# Patient Record
Sex: Male | Born: 1935 | Race: White | Hispanic: No | Marital: Married | Smoking: Never smoker
Health system: Southern US, Community
[De-identification: ages and names within clinical notes are randomized; demographics above are authoritative.]

## PROBLEM LIST (undated history)

## (undated) DIAGNOSIS — I10 Essential (primary) hypertension: Secondary | ICD-10-CM

## (undated) DIAGNOSIS — H269 Unspecified cataract: Secondary | ICD-10-CM

## (undated) HISTORY — DX: Unspecified cataract: H26.9

## (undated) HISTORY — DX: Essential (primary) hypertension: I10

## (undated) HISTORY — PX: PROSTATE SURGERY: SHX751

---

## 2000-01-05 ENCOUNTER — Ambulatory Visit (HOSPITAL_COMMUNITY): Admission: RE | Admit: 2000-01-05 | Discharge: 2000-01-05 | Payer: Self-pay | Admitting: Gastroenterology

## 2004-02-22 ENCOUNTER — Ambulatory Visit (HOSPITAL_COMMUNITY): Admission: RE | Admit: 2004-02-22 | Discharge: 2004-02-22 | Payer: Self-pay | Admitting: Ophthalmology

## 2006-10-01 ENCOUNTER — Ambulatory Visit: Payer: Self-pay | Admitting: Family Medicine

## 2006-10-01 ENCOUNTER — Inpatient Hospital Stay (HOSPITAL_COMMUNITY): Admission: EM | Admit: 2006-10-01 | Discharge: 2006-10-07 | Payer: Self-pay | Admitting: Emergency Medicine

## 2006-11-07 ENCOUNTER — Inpatient Hospital Stay (HOSPITAL_COMMUNITY): Admission: RE | Admit: 2006-11-07 | Discharge: 2006-11-10 | Payer: Self-pay | Admitting: Urology

## 2006-11-07 ENCOUNTER — Encounter (INDEPENDENT_AMBULATORY_CARE_PROVIDER_SITE_OTHER): Payer: Self-pay | Admitting: Urology

## 2009-05-02 IMAGING — US US RENAL
1 series · 14 of 25 positions shown · non-contrast
Comparison: CT scan 10/02/06.

CLINICAL DATA: Acute renal failure.  
RENAL/URINARY TRACT ULTRASOUND:
TECHNIQUE: Complete ultrasound examination of the urinary tract was performed including evaluation of the kidneys, renal collecting systems, and urinary bladder.

[Series 1: unknown · 0.27mm/px · 14 of 31 slices shown]
[im 1/31]
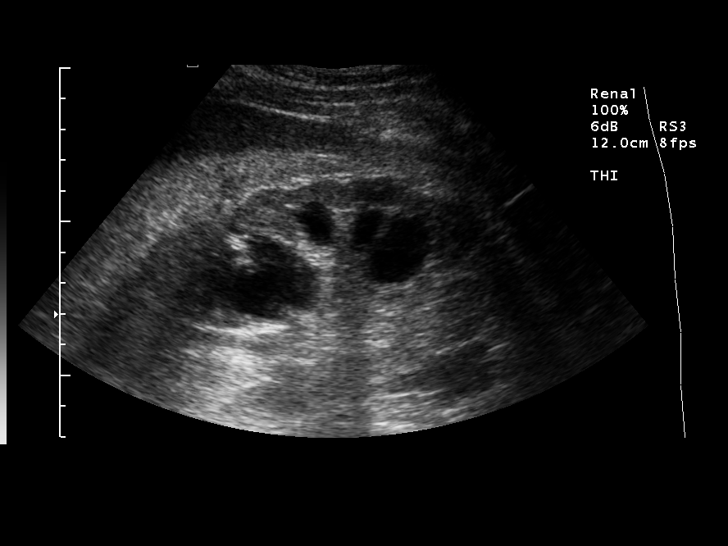
[im 3/31]
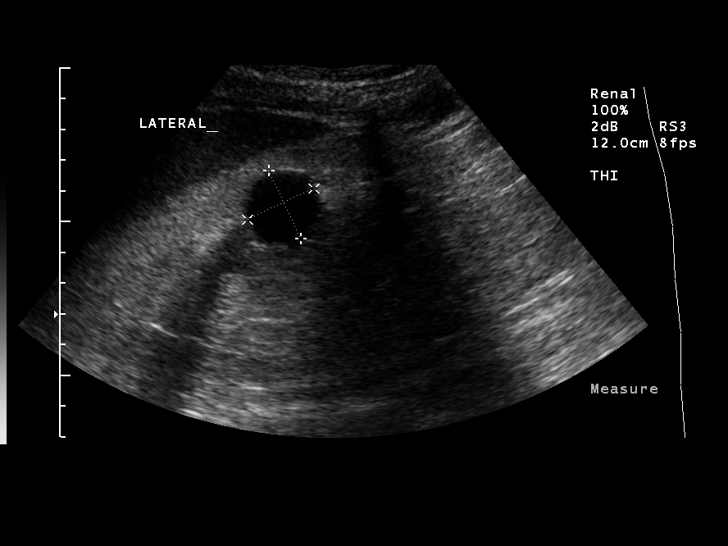
[im 6/31]
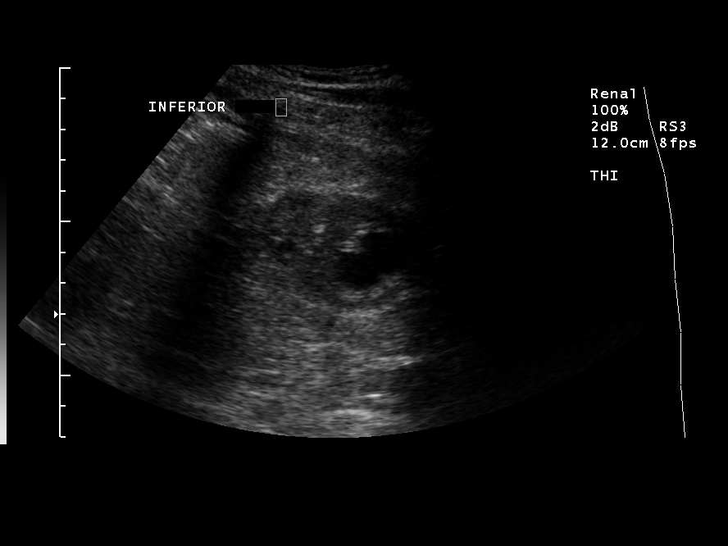
[im 8/31]
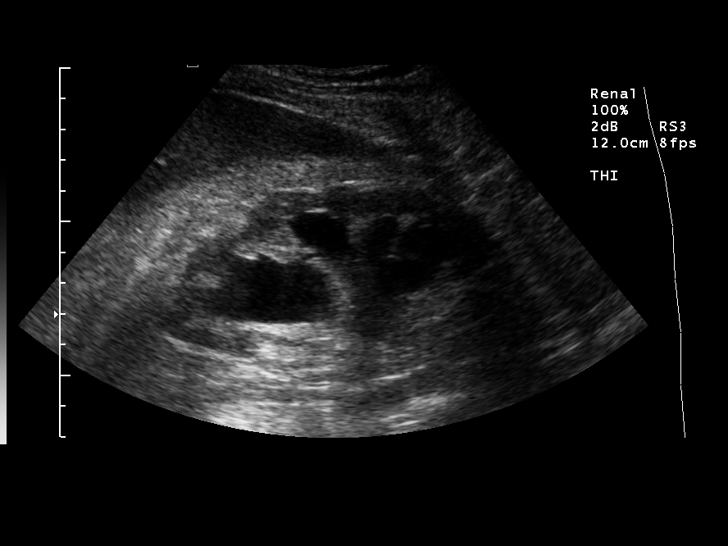
[im 11/31]
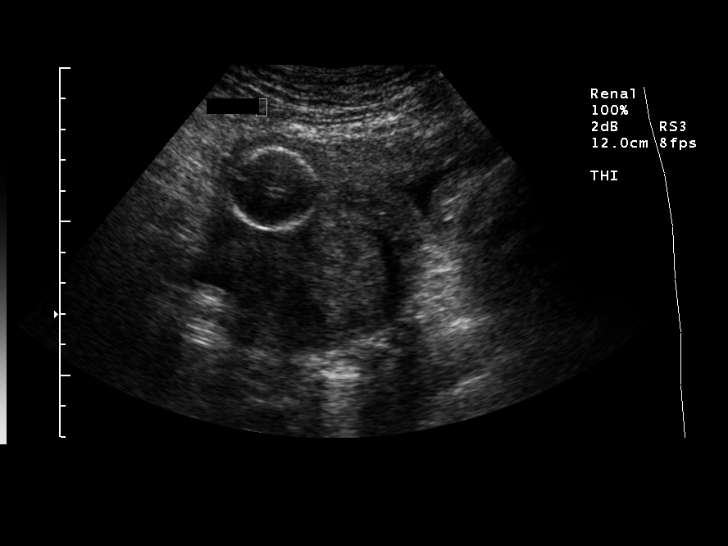
[im 12/31]
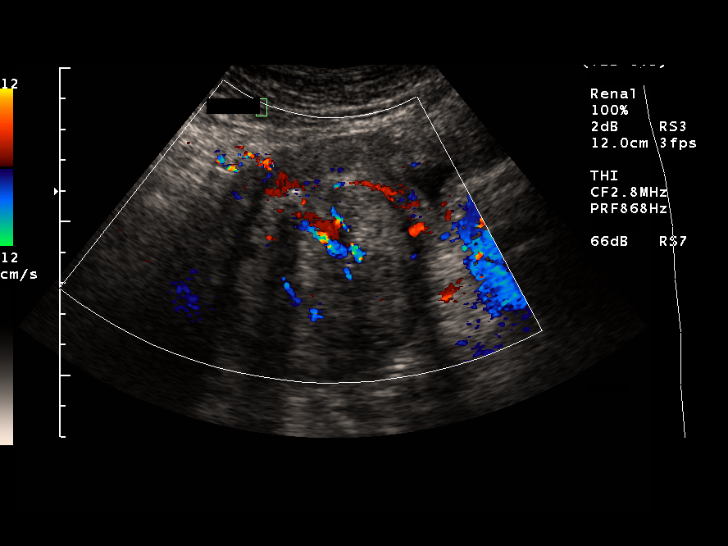
[im 14/31]
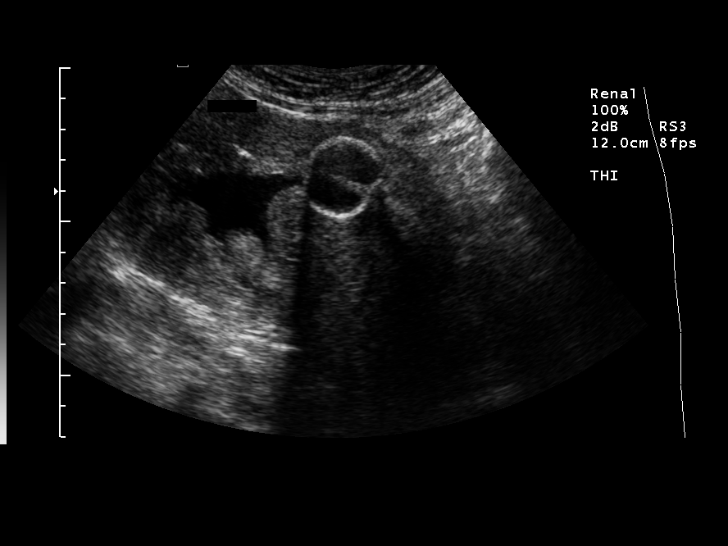
[im 17/31]
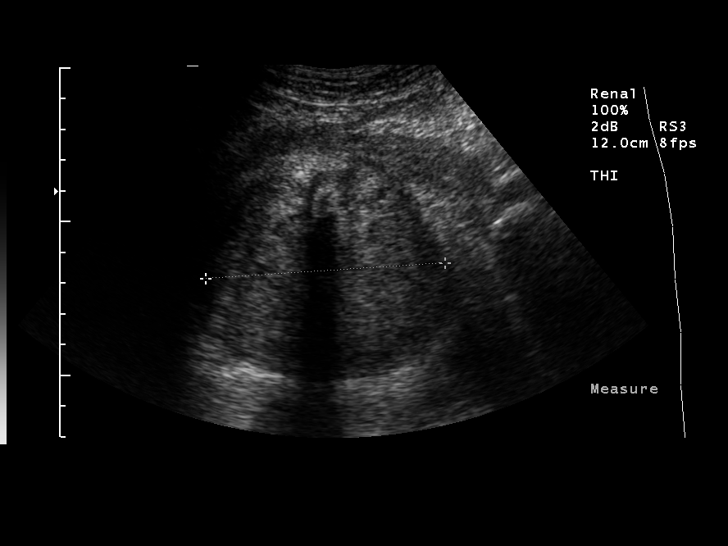
[im 19/31]
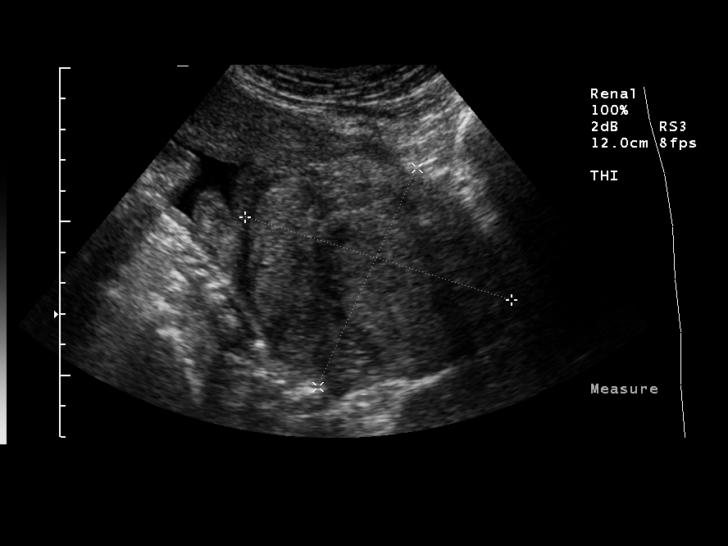
[im 21/31]
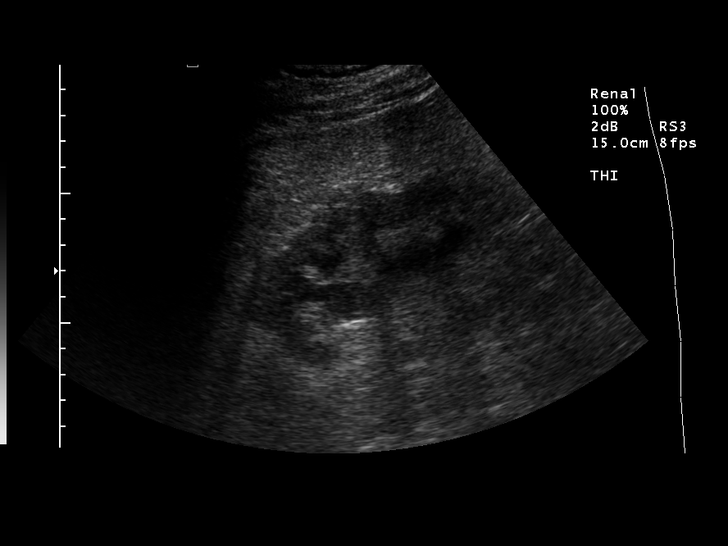
[im 23/31]
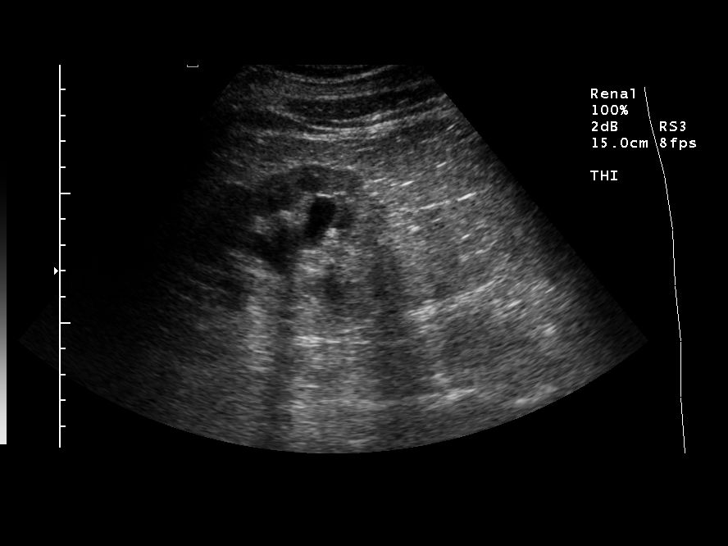
[im 26/31]
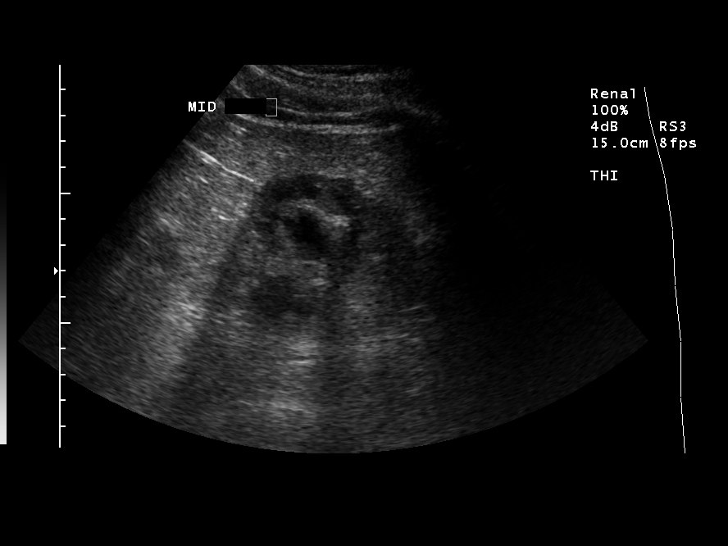
[im 28/31]
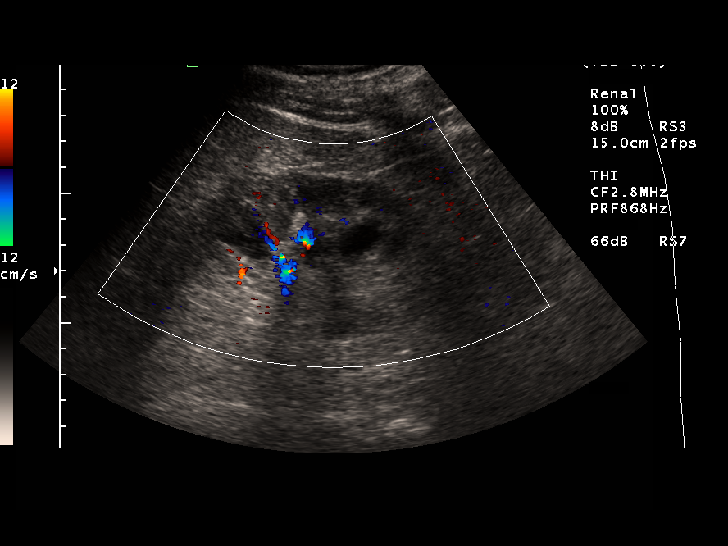
[im 31/31]
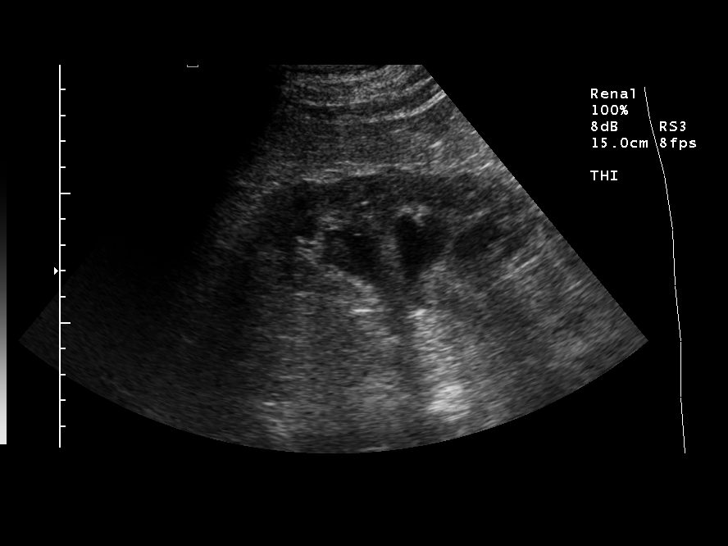

[14 of 25 positions shown; findings below may reference images not displayed]

FINDINGS: The right kidney measures 10.9 cm and the left kidney measures 11.4 cm.  Moderately severe bilateral hydronephrosis is noted.  Patient has a simple appearing cyst in the right kidney measuring 2.4 x 2.4 x 2.7 cm.  Foley catheter is in place in the urinary bladder with the wall of the urinary bladder markedly thickened.  Massive enlargement of the prostate gland is noted.
IMPRESSION: 1.  Moderately severe bilateral hydronephrosis.  
2.  Right renal cyst. 
3.  Marked prostatomegaly. 
4.   Thickened bladder wall compatible with outlet obstruction and/or cystitis.

## 2009-05-02 IMAGING — CT CT PELVIS W/O CM
1 of 2 series · 12 of 32 positions shown, 17 images · IV contrast ([ID]/WATER)
Comparison: None available.

CLINICAL DATA: 88-year-old with renal failure and pelvic mass.  
ABDOMEN CT WITHOUT CONTRAST:
TECHNIQUE: Multidetector CT imaging of the abdomen was performed following the standard protocol without IV contrast.
TECHNIQUE: Multidetector CT imaging of the pelvis was performed following the standard protocol without IV contrast.

[Series 400: reformatted · sagittal · 0.83mm/px · 12 of 165 slices shown, 17 images]
[im 10/165  soft-tissue]
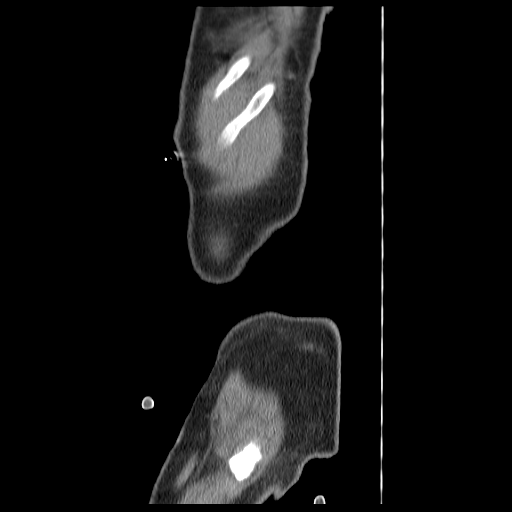
[im 10/165  lung]
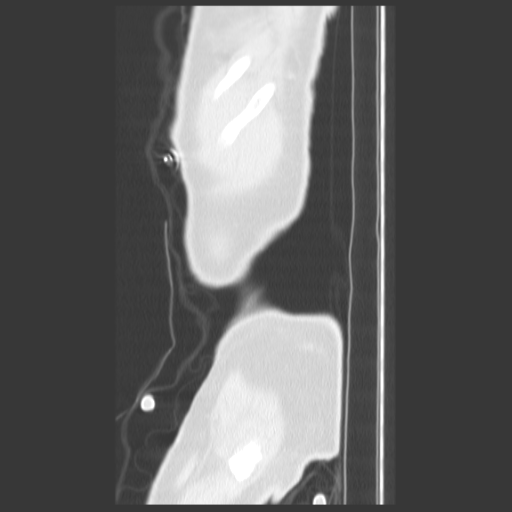
[im 10/165  bone]
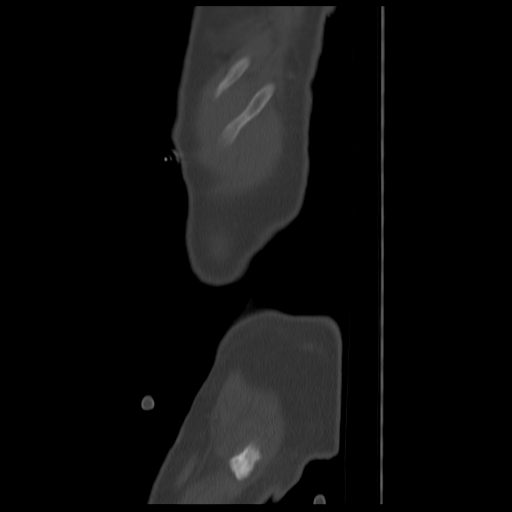
[im 19/165  lung]
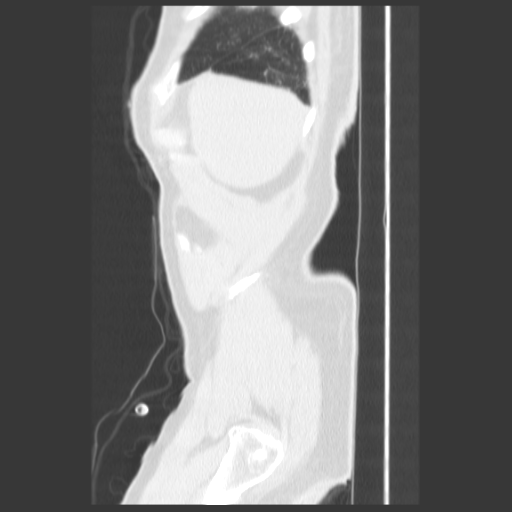
[im 28/165  soft-tissue]
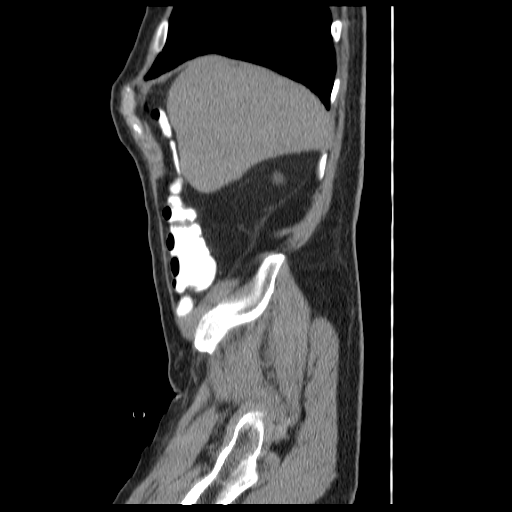
[im 28/165  lung]
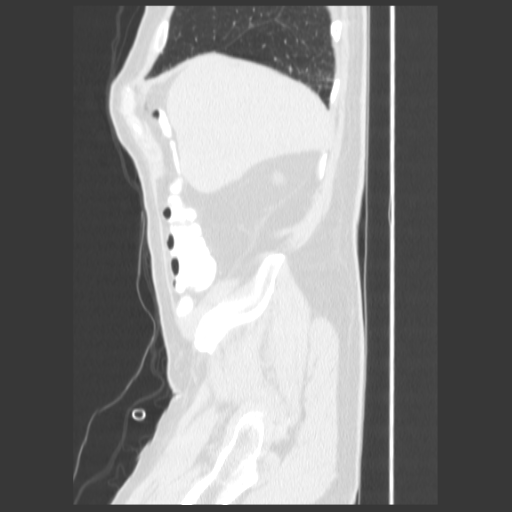
[im 37/165  soft-tissue]
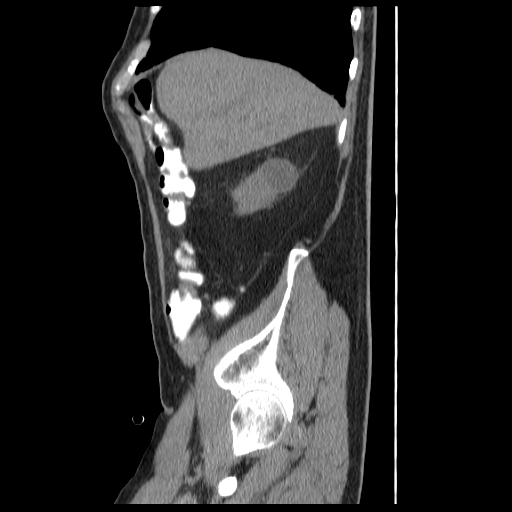
[im 37/165  lung]
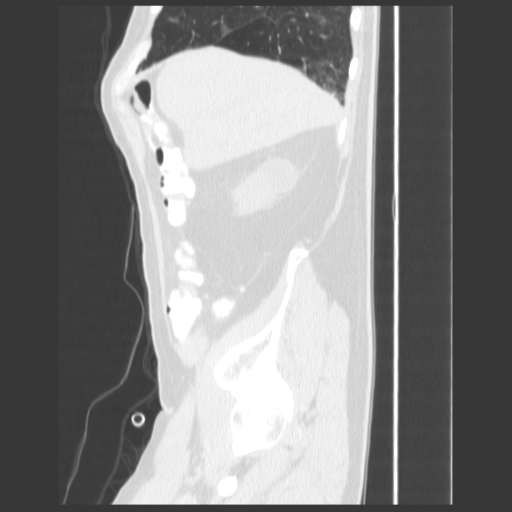
[im 55/165  soft-tissue]
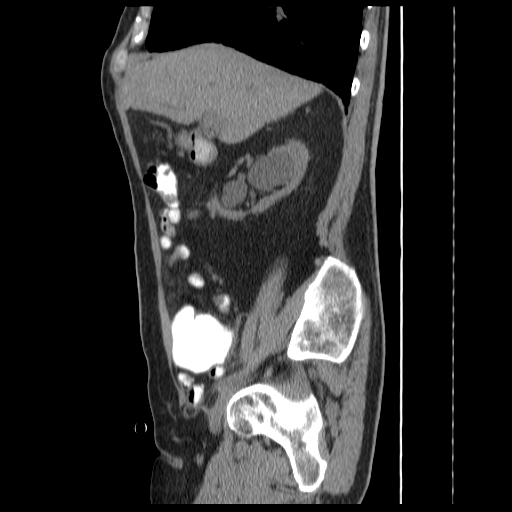
[im 64/165  soft-tissue]
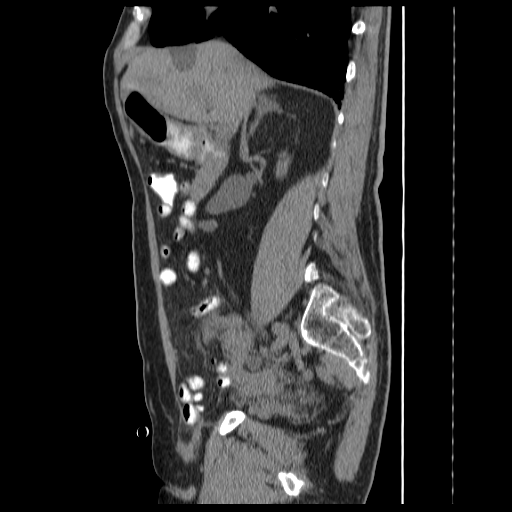
[im 83/165  soft-tissue]
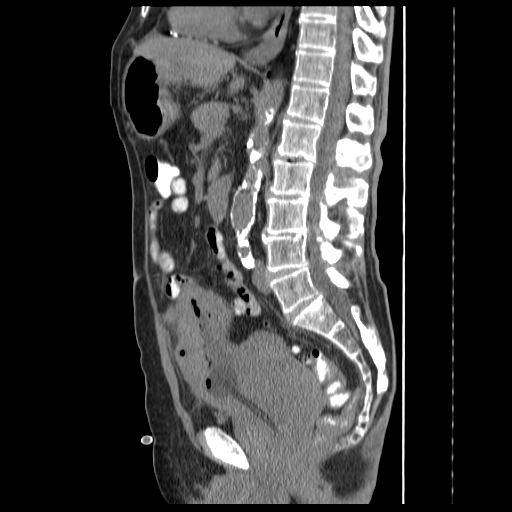
[im 101/165  soft-tissue]
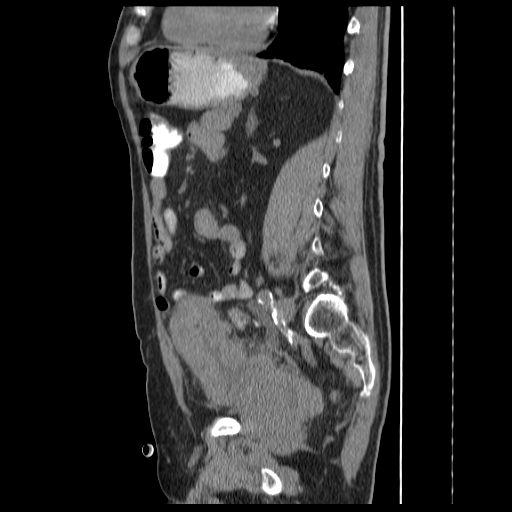
[im 110/165  soft-tissue]
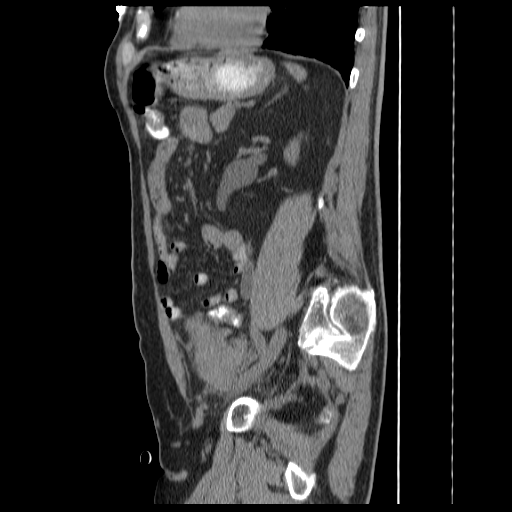
[im 128/165  soft-tissue]
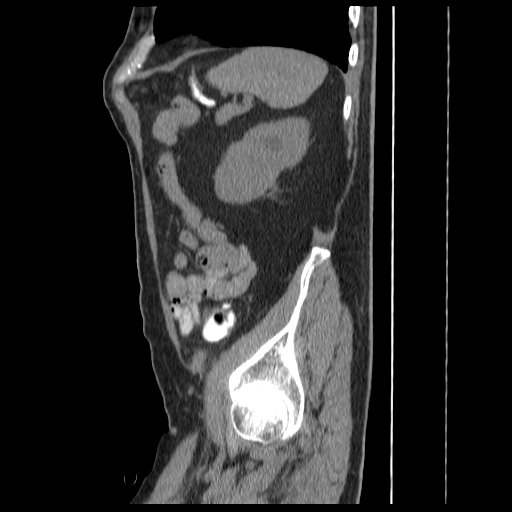
[im 128/165  bone]
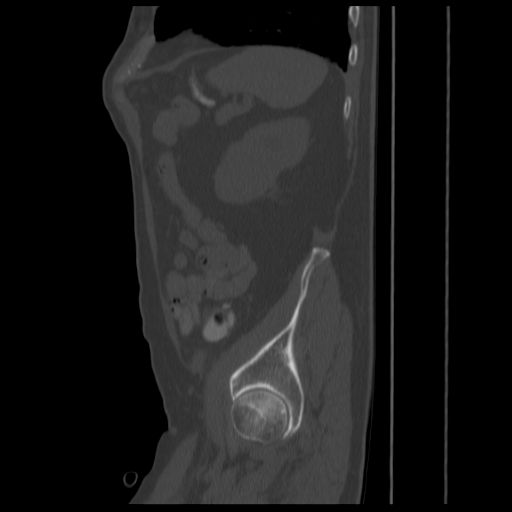
[im 137/165  soft-tissue]
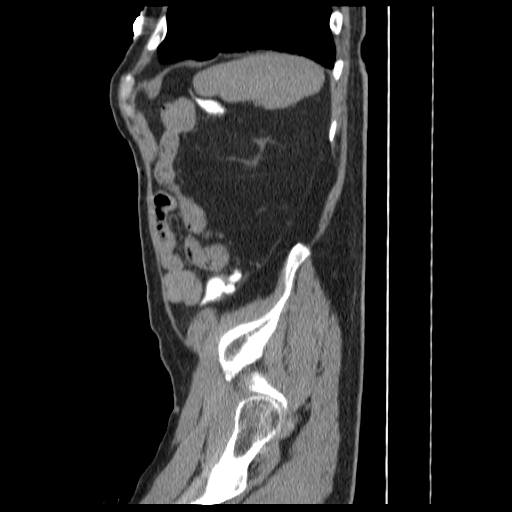
[im 155/165  soft-tissue]
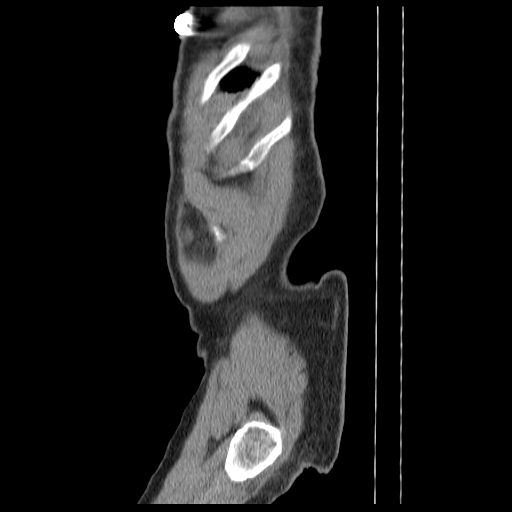

[12 of 32 positions shown; findings below may reference images not displayed]

FINDINGS: A 4 mm nodule in the left lower lobe on sequence image number 1.  Patchy areas of ground-glass attenuation in the lung bases particularly in the right middle lobe.  Findings could represent areas of atelectasis but this is also concerning for an infectious etiology.  There is no evidence for free air.  There is a 2.8 cm low-density structure along the dome of the liver, suggestive for a cyst.  Evaluation of intraabdominal structures is inherently limited without IV contrast.  There is a prominent calcification within the gallbladder measuring up to 1 cm suggestive for cholelithiasis.  Indeterminate 1.3 cm low-density lesion along medial aspect of the spleen on sequence 2 image 14.  This may be vascular in nature.  The pancreas and adrenal glands are within normal limits.  There is a small hiatal hernia.  Incidentally, the patient has a retroaortic left renal vein.  The descending abdominal aorta is heavily calcified and measures 2.2 cm in diameter.  There is a focal area of colonic wall thickening involving the descending colon best seen on sequence 2 image number 43.  Findings are concerning for an underlying colonic mucosal lesion.  This is also well seen on the sagittal reformats which is sequence 400 image 150.  There is dilatation of the renal collection systems bilaterally with mild cortical thinning.  There is a prominent exophytic cystic structure coming off the right kidney that measures 3.1 cm consistent with a cyst.  Both ureters are dilated down to the level of the urinary bladder.  The urinary bladder extends into the lower abdomen demonstrating marked wall thickening and irregularity.  There is a Foley catheter in place.  Mild degenerative changes in the spine.  No acute bone abnormalities.
IMPRESSION: 1.  Moderate to severe hydroureteronephrosis appears to be related to bladder obstruction.  Please refer to the CT of the pelvis.  
2.  Suspicion for a lesion involving the descending colon.  Recommend further evaluation with colonoscopy or a contrast enema.  
3.  Patchy areas of ground-glass attenuation in the lung bases.  Differential would include atelectasis vs edema vs infection.  There is also a small 4 mm indeterminate nodule in the left lung base.  
4.  Hiatal hernia.
5.  Cholelithiasis.  
PELVIS CT WITHOUT CONTRAST:
FINDINGS: The urinary bladder is markedly thickened with a Foley catheter in place.  There is gas within the Foley catheter probably iatrogenic in nature.  In addition, the prostate gland is markedly enlarged measuring up to 8.7 cm in diameter.  Foley catheter appears to be within the urinary bladder.  There is no significant free fluid appreciated.  Multiple vascular structures in the pelvis and difficult to exclude areas of lymphadenopathy.  There is a small right inguinal hernia containing fat and a small amount of bowel but no evidence for obstruction.  There are no acute bone abnormalities.
IMPRESSION: Massive enlargement of the prostate gland with marked thickening and irregularity of the bladder wall.  Findings may be related to prostatic enlargement causing bladder obstruction.  A bladder mucosal lesion or diffuse infiltrative process cannot be excluded.

## 2010-10-11 NOTE — Op Note (Signed)
Eugene Cortez, Eugene NO.:  [id number]   MEDICAL RECORD NO.:  [id number]          PATIENT TYPE:  INP   LOCATION:  1444                         FACILITY:  [facility]   PHYSICIAN:  Valetta Fuller, M.D.  DATE OF BIRTH:  Dec 09, 1918   DATE OF PROCEDURE:  11/07/2006  DATE OF DISCHARGE:                               OPERATIVE REPORT   PREOPERATIVE DIAGNOSIS:  Benign prostatic hypertrophy, urinary retention  with indwelling Foley catheter.   POSTOPERATIVE DIAGNOSIS:  Benign prostatic hypertrophy, urinary  retention with indwelling Foley catheter.   PROCEDURE:  Open simple retropubic prostatectomy.   SURGEON:  Valetta Fuller, M.D.   ASSISTANT:  Terie Purser, M.D.   ANESTHESIA:  General.   SPECIMEN:  Prostate to pathology for permanent analysis.   ESTIMATED BLOOD LOSS:  1400 mL.   FLUIDS:  Crystalloid 1800 mL, Hexedine 500 mL, packed red blood cells 2  units.   COMPLICATIONS:  None.   DRAINS:  24-French three-way Foley catheter with slight traction and CBI  with 15 mL in balloon, JP drain to bulb suction.   DISPOSITION:  Stable to post anesthesia care unit.   INDICATIONS FOR PROCEDURE:  Eugene Cortez is an 77 year old male with a  history of BPH.  He has a massively enlarged prostate.  He was recently  admitted for urinary retention with a creatinine of 14.  He was managed  with a Foley catheter and his creatinine has improved to a level around  2.  He was counseled regarding treatment options.  Because the patient  has had significant symptoms, he was offered open prostatectomy and has  agreed to proceed with this procedure after full discussion of benefits  and risks.  Consent was obtained.   DESCRIPTION OF PROCEDURE:  The patient was brought to the operating room  and was properly identified.  A time out was performed to confirm the  correct patient and procedure.  He was administered general anesthesia,  given preoperative antibiotics, and placed in the  supine position on the  operating table, and prepped and draped in a sterile fashion.  We made a  lower midline incision from the umbilicus down to the pubic bone.  Dissection was carried down using Bovie cautery through the subcutaneous  tissues.  The rectus fascia was divided in the midline and the space of  Retzius was developed.  An Omnitrak retractor was placed for exposure.   The patient had a massively enlarged prostate.  There were several  prominent veins coursing on the surface of the capsule.  These were  primarily located on the left side and were controlled using figure-of-  eight sutures.  We then cleared off the anterior portion of the prostate  from the surrounding fatty tissue.  Four 2-0 Vicryl stay sutures were  placed, two laterally and one proximally at the bladder neck and one  distally for control of the capsule.  We then made a transverse incision  in the capsule using the Bovie cautery down to the adenoma.  Metzenbaum  scissors were used to develop a plane between the capsule and the  adenoma.  Digital enucleation was then performed to remove the  adenomatous tissue.  The prostate did have significantly enlarged  lateral lobes.  There was a small median lobe which was also removed.  The prostate appeared to be removed intact without tear of the capsule.   Indigo carmine was given and the bladder neck was inspected.  Blue urine  was seen emanating from the ureteral orifices and the trigone did not  appear to be involved.  We subsequently packed the prostatic fossa.  Bleeding points were controlled using 2-0 Vicryl figure-of-eight sutures  primarily at the 5 and 7 o'clock positions.  We did take a portion of  the bladder neck and affix it to the posterior aspect of the capsule to  create a smooth transition from the bladder neck into the prostatic  fossa.  This was affixed using 2-0 Vicryl suture.  Hemostasis appeared  well controlled.  A Foley catheter was placed  per urethra and guided  into the bladder.  We then proceeded with closure of the capsule.  This  was done in a running fashion using 2-0 Vicryl suture.  The balloon in  the Foley catheter was inflated and the catheter was irrigated.  This  irrigated easily without significant amount of clot.  The closure did  not leak and was watertight.   We then carefully inspected the pelvis.  There continued to be some  oozing from capsular veins.  A small bleeder was controlled with a  figure-of-eight Vicryl suture.  Then, Surgicel was placed over the  prostate.  A Jackson-Pratt drain was brought out through a separate stab  incision in the left lower quadrant.  The Foley catheter was then  irrigated again and placed on slight traction with continuous bladder  irrigation.  There was return of light pink urine.  We then proceeded to  close.  The fascia was closed in a running fashion with #1 PDS.  The  wound was then irrigated and closed with staples.  A sterile dressing  was applied.  The patient was awakened from anesthesia and transported  to the recovery room in stable condition.  All sponge and needle counts  were correct at the end of the case.  There were no complications.  Please note Dr. Isabel Caprice was present and participated in all aspects of  the procedures as he was the primary surgeon.     ______________________________  Terie Purser, MD      Valetta Fuller, M.D.  Electronically Signed    JH/MEDQ  D:  11/07/2006  T:  11/07/2006  Job:  [id number]

## 2010-10-11 NOTE — H&P (Signed)
Eugene, Eugene NO.:  [id number]   MEDICAL RECORD NO.:  [id number]          PATIENT TYPE:  INP   LOCATION:  1444                         FACILITY:  [facility]   PHYSICIAN:  Valetta Fuller, M.D.  DATE OF BIRTH:  01-Aug-1918   DATE OF ADMISSION:  11/07/2006  DATE OF DISCHARGE:                              HISTORY & PHYSICAL   CHIEF COMPLAINT:  Urinary retention, benign prostatic hypertrophy.   HISTORY OF PRESENT ILLNESS:  [name] is an 77 year old male who has a  history of BPH.  He has had a history of elevated PSA in the past with  significantly enlarged prostate.  He has had negative biopsies in the  past by report.  He was recently admitted to the hospital in acute renal  failure with a creatinine of 14.  He had bilateral hydronephrosis and  somewhat atrophic kidneys.  He was treated medically with a Foley  catheter during his hospitalization.  His acute renal failure resolved  and he was discharged from the hospital with a creatinine of  approximately 2.  He was evaluated by Dr. Isabel Caprice and was counseled  regarding surgical intervention to include an open simple prostatectomy,  given the massive size of his prostate.  He is admitted this  hospitalization for this procedure.   The patient states that he has otherwise been in his usual health.  In  addition to his history of urinary retention prior to this he did  complain of significant nocturia and obstructive voiding symptoms.  He  denies any erection or CV.  Denied any erectile problems or urinary  tract infection or history of hematuria.   ALLERGIES:  None.   MEDICATIONS:  1. Flomax 0.4 mg daily.  2. Atenolol 50 mg daily.  3. Levaquin 250 mg daily.   PAST MEDICAL HISTORY:  1. Hypertension.  2. History of shingles.  3. Hiatal hernia.   PAST SURGICAL HISTORY:  Cataract surgery.   FAMILY MEDICAL HISTORY:  Negative for any GU malignancy or kidney  stones.   SOCIAL HISTORY:  He denies any  alcohol, tobacco or drug use.   REVIEW OF SYSTEMS:  Multisystem review is performed as above in the  history of present illness, otherwise negative.   PHYSICAL EXAMINATION:  Temperature is 97.2, pulse 59, respirations 18,  blood pressure 114/59, O2 saturations are 97%.  GENERAL:  Elderly-appearing white male in no acute distress.  HEENT:  Normocephalic, atraumatic.  NECK:  Supple without lymphadenopathy.  CARDIOVASCULAR:  Regular rate and rhythm without murmurs, rubs, or  gallops.  PULMONARY:  Clear to auscultation bilaterally.  ABDOMEN:  His abdomen is soft, nontender, and nondistended with  normoactive bowel sounds.  GENITOURINARY:  Reveals a normal phallus.  His testicles are descended  bilaterally.  Foley catheter in place with clear urine.  EXTREMITIES:  There is no cyanosis, clubbing, or edema.  PSYCHIATRIC:  Alert and oriented x3.   LABORATORY DATA:  Hemoglobin is 10, hematocrit is 29.3.  Sodium 140,  potassium 4.9, chloride 107, CO2 27, BUN 24, creatinine 1.79, glucose is  116.  Calcium is 9.3.  ASSESSMENT/PLAN:  An 77 year old male with history of benign prostatic  hypertrophy and urinary retention., renal insufficiency, hypertension.   PLAN:  The patient will be admitted postoperatively after undergoing a  simple retropubic prostatectomy for routine postoperative care.     ______________________________  Terie Purser, MD      Valetta Fuller, M.D.  Electronically Signed    JH/MEDQ  D:  11/07/2006  T:  11/07/2006  Job:  [id number]

## 2010-10-14 NOTE — Discharge Summary (Signed)
Eugene Cortez, Eugene Cortez NO.:  [id number]   MEDICAL RECORD NO.:  [id number]          PATIENT TYPE:  INP   LOCATION:  1444                         FACILITY:  [facility]   PHYSICIAN:  Valetta Fuller, M.D.  DATE OF BIRTH:  [birth year]   DATE OF ADMISSION:  11/07/2006  DATE OF DISCHARGE:  11/10/2006                               DISCHARGE SUMMARY   ADMISSION DIAGNOSES:  1. Urinary retention.  2. Benign prostatic hypertrophy.   DISCHARGE DIAGNOSES:  1. Urinary retention.  2. Benign prostatic hypertrophy.   PROCEDURE PERFORMED:  Simple retropubic prostatectomy.   HISTORY OF PRESENT ILLNESS:  For full details please see dictated H&P.  Briefly, Eugene Cortez is an 77 year old male with a history of BPH and  elevated PSA.  He has had negative biopsies in the past.  He has had  recent admission for acute renal failure and was in urinary retention.  He has a massively enlarged prostate and is admitted for an open simple  prostatectomy.   HOSPITAL COURSE:  The patient was brought to the hospital on November 07, 2006, for an open simple prostatectomy.  He tolerated this procedure  well without complication and was transferred to the floor in stable  condition.  On the floor, he had an uneventful postoperative course.  His diet was slowly advanced.  His bladder irrigation was slowly weaned  off on post-op day #2.  He was ambulating without difficulty.  His pain  was well-controlled.  He was deemed stable for discharge on the morning  of post-op day #3.   DISCHARGE INSTRUCTIONS:  1. Eugene Cortez was discharged to home in stable and improved condition.  2. He was instructed increase his activity slowly.  3. Instructed not to lift for 12 weeks or drive for 4 weeks.  4. He was instructed to call with any questions or concerns regarding      his care, specifically any fevers greater than 101.5 or increased      abdominal pain, nausea, vomiting, redness or drainage from      incision.  He  understood and agreed to these instructions.   MEDICATIONS AT DISCHARGE:  Will include his previous medications per the  medical reconciliation sheet.  He was also given a prescription for  Vicodin as needed for pain and Colace as needed for constipation.   He will return see Dr. Brunilda Payor in approximately 1 week for staple and  catheter removal.  The office will call with appointment.     ______________________________  Terie Purser, MD      Valetta Fuller, M.D.  Electronically Signed    JH/MEDQ  D:  11/23/2006  T:  11/23/2006  Job:  [id number]

## 2010-10-14 NOTE — Consult Note (Signed)
Eugene Cortez, Eugene Cortez               ACCOUNT NO.:  [id number]   MEDICAL RECORD NO.:  [id number]          PATIENT TYPE:  INP   LOCATION:  6727                         FACILITY:  [facility]   PHYSICIAN:  Ronald L. Earlene Plater, M.D.  DATE OF BIRTH:  18-Apr-1919   DATE OF CONSULTATION:  10/02/2006  DATE OF DISCHARGE:                                 CONSULTATION   GU consult for acute renal failure, severe bilateral hydronephrosis,  severe enlarged prostate and creatinine of 14.5.   HPI:  Patient complains of increased fatigue, weakness and weight loss  over the past 2 months, having progressively worsening difficulty  urinating and abdominal bloating.  He had increased night time voids,  decreased force of stream and decreased voided volumes.  History of  remote prostate biopsy with Dr. Isabel Caprice several years ago.  Prostate  biopsy was negative.  He was followed for several years; however, he has  not seen a urologist in quite some time.   Patient presented to urgent care yesterday for increasing abdominal pain  and bloating where he was found to have an elevated creatinine.  He was  transferred to the ER at that point.  A Foley catheter was placed and  greater than 1000 mL of urine was drained.   Renal ultrasound was obtained, which showed severe bilateral  hydronephrosis, mass enlargement of prostate and marked bladder wall  thickening.  This was confirmed by CT scan of the abdomen and pelvis.   PAST MEDICAL HISTORY:  Positive for:  1. Hypertension.  2. Cataract removal.   MEDICATIONS:  1. Atenolol 50 mg p.o. daily.  2. Lisinopril/hydrochlorothiazide 20/25 mg p.o. daily.  3. Cardura 8 mg p.o. daily.   ALLERGIES:  NONE KNOWN.   SOCIAL HISTORY:  Negative ETOH or tobacco use.   FAMILY HISTORY:  Father deceased of emphysema.  Mother deceased of  Alzheimer.   REVIEW OF SYSTEMS:  Negative shortness of breath.  Negative chest pain.  Mild abdominal discomfort, slightly improved since Foley  catheter  placed; otherwise, benign.   PHYSICAL EXAMINATION:  GENERAL:  [age]-year-old white male.  He is  in no acute distress.  He is alert and oriented x3.  VITAL SIGNS:  Temperature 97.4.  Pulse 65.  Respirations 20.  Blood  pressure 77/43.  HEENT:  Normocephalic.  Sclerae are clear.  LUNGS:  Clear to auscultation.  CARDIAC:  Regular rate and rhythm.  ABDOMEN:  Soft, nontender.  Without masses or organomegely.  GU:  Foley drained clear urine.  LOWER EXTREMITIES:  Negative edema.  Negative Homans.   LABORATORY DATA:  Sodium 139, potassium 4.9, chloride 103, CO2 12, BUN  182 down from 199, creatinine 11.4, down from 14.15, glucose 101.  WBC  12.5, hemoglobin 8.4, hematocrit 24.5, platelets 470.  Preliminary blood  cultures negative.  Preliminary urine culture is negative.  Microscopic  UA showed too numerous to count white cells, too numerous to count RBCs  and many bacteria.   IMPRESSION:  1. Acute renal failure.  2. Severe bilateral hydronephrosis.  3. Enlarged prostate.  4. Obstructed uropathy.   PLAN:  1. Continue  to monitor BUN and creatinine daily as long as creatinine      continues to decrease.  If creatinine worsens, he will need      bilateral drainage.  Once renal function stabilizes, outlet      obstruction will need to be addressed.  Dr. Isabel Caprice will continue to      follow.     ______________________________  Alessandra Bevels. Chase Picket, FNP-C      Ronald L. Earlene Plater, M.D.  Electronically Signed    JML/MEDQ  D:  10/02/2006  T:  10/03/2006  Job:  [id number]

## 2010-10-14 NOTE — Procedures (Signed)
[place]  Patient:    Eugene Cortez, Eugene Cortez                        MRN: [id number] Proc. Date: 01/05/00 Adm. Date:  [id number] Attending:  Charna Elizabeth CC:         Aura Dials, M.D.   Procedure Report  DATE OF BIRTH:  1918/09/21  PROCEDURE:  Colonoscopy with snare polypectomy x 1.  ENDOSCOPIST:  Anselmo Rod, M.D.  INSTRUMENT USED:  Olympus video colonoscope.  INDICATIONS FOR PROCEDURE:  Rectal bleeding in a 77 year old white male, rule out colonic masses, polyps, hemorrhoids, etc.  PREPROCEDURE PREPARATION:  Informed consent was obtained from the patient. The patient was fasted for 8 hours prior to the procedure and prepped with a bottle of magnesium citrate and a gallon of Nylytely the night prior to the procedure.  PREPROCEDURE PHYSICAL EXAMINATION:  VITAL SIGNS:  Stable.  NECK:  Supple.  CHEST:  Clear to auscultation.  S1 and S2 regular.  ABDOMEN:  Soft with normal abdominal bowel sounds.  DESCRIPTION OF PROCEDURE:  The patient was placed in the left lateral decubitus position and sedated with 40 mg of Demerol and 2 mg of Versed intravenously.  Once the patient was adequately sedated and maintained on low flow oxygen and continuous cardiac monitoring the Olympus video colonoscope was advanced from the rectum to the cecum without difficulty.  A pedunculated 5 to 6 mm polyp was seen at 30 cm.  This was removed by snare polypectomy. The patient also had small non-bleeding internal hemorrhoids seen on retroflexion.  There was extensive left-sided diverticulosis with inspissated stool in several of the diverticular pockets.  The rest of the colon, cecum, right colon, transverse colon appeared normal.  IMPRESSION: 1. Severe left-sided diverticular disease with stool in several of the    diverticular pockets. 2. Small non-bleeding internal hemorrhoids. 3. Vascular polyp measuring about 5 to 6 mm snared at 30 cm. 4.  Normal-appearing cecum, right colon, and transverse colon.  RECOMMENDATIONS: 1. Avoid nonsteroidals for the next seven days. 2. Avoid nuts, seeds, and popcorn in diet. 3. Await pathology results. 4. Outpatient followup in the next two weeks. DD:  01/05/00 TD:  01/05/00 Job: [id number]

## 2010-10-14 NOTE — Op Note (Signed)
[facility]               ACCOUNT NO.:  [id number]   MEDICAL RECORD NO.:  [id number]          PATIENT TYPE:  OIB   LOCATION:  2899                         FACILITY:  [facility]   PHYSICIAN:  Alford Highland. Rankin, M.D.   DATE OF BIRTH:  1918-12-02   DATE OF PROCEDURE:  02/22/2004  DATE OF DISCHARGE:  02/22/2004                                 OPERATIVE REPORT   PREOPERATIVE DIAGNOSES:  1.  Dense vitreous hemorrhage, left eye, of unknown etiology.  2.  Hyphema of the left eye secondary to #1.   POSTOPERATIVE DIAGNOSES:  1.  Dense vitreous hemorrhage, left eye, of unknown etiology.  2.  Hyphema of the left eye secondary to #1.   PROCEDURE:  1.  Posterior vitrectomy with endolaser pan-photocoagulation of left eye.  2.  Anterior chamber washout, left eye.   SURGEON:  Alford Highland. Rankin, M.D.   ANESTHESIA:  Local retrobulbar with monitored anesthesia control.   INDICATION FOR PROCEDURE:  The patient is an 77 year old man who has  profound vision loss in the left eye on an idiopathic basis.  He has had  dense vitreous hemorrhage which is non-clearing and appears chronic.  He was  found on ultrasound to have no signs of retinal tears or detachment, no  masses and no obvious arterial or macular aneurysm.   The patient understands this is an attempt to clear the visual axes so as to  assess the underlying etiology.  This is also an attempt to clear the  anterior chamber of the fresh new hyphema which does threaten significant  risk of glaucomatous damage.  There also is a risk of corneal blood-  staining.  The patient understands the risks of anesthesia including the  rare occurrence of death, but also to the eye including, but not limited to,  hemorrhage, infection, scarring, need for further surgery, no change in  vision, loss of vision and progressive disease despite intervention.   DESCRIPTION OF PROCEDURE:  After appropriate signed consent was obtained,  the patient was taken to the  operating room.  In the operating room,  appropriate monitoring was followed by mild sedation.  Marcaine 0.75% was  delivered, 5 mL, retrobulbar followed by an additional 5 mL laterally in a  fashion of modified Darel Hong.  The left periocular region was sterilely  prepped and draped in the usual ophthalmic fashion.  A lid speculum was  applied.  Conjunctival peritomy was then fashioned temporally and  superiorly.  A 4-mm infusion was secured 3.5 mm posterior to the limbus in  the inferotemporal quadrant.  Placement in the vitreous cavity could not be  verified, thus it was necessary to place a Lewicky anterior chamber  maintainer and use the initial infusion through the anterior chamber.  Core  vitrectomy was then begun.  Vitreous was cleared overlying the posterior  infusion cannula.  Thereafter, the Unice Bailey was quickly removed.  View  improved through the anterior chamber and the posterior infusion was used  through the remainder of the procedure.  Core vitrectomy was then begun.  Peripheral vitreous was then trimmed using scleral depression.  No retinal  holes or tears were identified.  No masses were identified.  No obvious  maculopathy from age-related maculopathy was noted.  Care was taken to look  for possible involutional retinal artery microaneurysms.  None was found.  Prophylactic endolaser photocoagulation was placed 360 degrees and at the  equator for prophylaxis of retinal tear and detachment.  It must be noted  that the vitreous base was opaque from old blood.   Posterior capsulotomy was enlarged as well.  This allowed for enhancement of  visualization because of the dense peripheral capsule opacification.   At this time, instruments were removed from the eye and superior  sclerotomies were closed with 7-0 Vicryl.  The infusion wound was similarly  closed.  Conjunctiva was closed with 7-0 Vicryl.  Subconjunctival injections  of antibiotic and steroid were applied.  A sterile  patch and Fox shield were  applied.  The patient tolerated the procedure well without complication.  He  was taken to the recovery room in good and stable condition, where he was  taken to the short-stay room to be discharged as an outpatient.       GAR/MEDQ  D:  02/22/2004  T:  02/23/2004  Job:  [id number]

## 2010-10-14 NOTE — Discharge Summary (Signed)
[facility], STEBNER               ACCOUNT NO.:  [id number]   MEDICAL RECORD NO.:  [id number]          PATIENT TYPE:  INP   LOCATION:  6727                         FACILITY:  [facility]   PHYSICIAN:  [name], M.D.   DATE OF BIRTH:  1919/01/05   DATE OF ADMISSION:  10/01/2006  DATE OF DISCHARGE:  10/06/2006                               DISCHARGE SUMMARY   ADMITTING DIAGNOSES:  1. Acute renal failure.  2. Dehydration.  3. Suprapubic mass.  4. Anemia.  5. Hypertension.  6. Hyperkalemia.   DISCHARGE DIAGNOSES:  1. Acute renal failure, resolving.  2. Obstructing benign prostatic hypertrophy.  3. Anemia.  4. Hypertension.   DISCHARGE MEDICATIONS:  1. Flomax 0.4 mg 1 tablet p.o. daily.  2. Patient was instructed to stop taking the atenolol and Cardura      until he sees his physician at [facility] because his blood pressure      was low throughout his hospital stay.   HOSPITAL COURSE:  This is a very pleasant 77 year old gentleman who was  sent over from his [facility] for a  very elevated creatinine and dehydration.  On admission, it was noted  that his creatinine was markedly elevated at 14.15.  The patient had  several complaints and had not been eating or drinking well.  He did  complain of not being able to urinate as well as he had prior; however,  did not notice this until we had asked him.  The patient was admitted  for acute renal failure and hydration and as well as to workup the  suprapubic mass that we felt during admission physical exam.   Hospital course is as follows:  1. Acute renal failure.  The patient was admitted with a severely,      markedly elevated creatinine to 14.15.  We began aggressive      rehydration with a bolus in the ED, as well as IV fluids at 100 mL      an hour.  We continued to monitor serial creatinines.  His      creatinine decreased remarkably well throughout his hospital course      and by day of  discharge was actually 2.63 and this occurred with      hydration alone and also placement of a Foley catheter.  The      patient will go home with an indwelling Foley catheter and he will      follow up with urology early next week on May 14.  Urology was      consulted during this hospital stay because it was noted on both      physical exam, renal ultrasound and CT scan that the patient had a      severely enlarged prostate, as well as bilateral moderately severe      hydronephrosis.  Urology, we appreciated their consult, and they      stated that this patient could be managed medically in the time      being until his acute renal failure was resolved.  They recommended  hydration and to keep the Foley in.  Once his renal failure is      resolved, this patient will need surgical intervention, either a      prostatectomy or a nephrostomy to adjust this gentleman with his      obstructive BPH.  Please note that the patient's creatinine has      continued to trend down and will most likely be normal within the      next day or 2 and he will have close followup next week at urology.  2. Dehydration.  We hydrated the patient aggressively with normal      saline boluses and normal saline at 100 mL an hour with 20 mL of      potassium and he continued to do well as well as tolerated adequate      p.o. diet.  3. Suprapubic mass/obstructive BPH.  It was noted on physical exam      that this patient had an enlarged mass, almost gravid in      appearance.  Once the Foley was placed, almost 1 liter of urine was      obtained, this mass was decreased and we felt that it was his      bladder.  Once we got imaging, we were assured that it was only his      bladder, which was markedly enlarged, as well as his prostate was      enlarged, causing this enlargement of the bladder and also causing      the hydronephrosis.  Urology was consulted as dictated above and      recommended keeping the Foley  in place until surgical intervention      could be done in an outpatient setting once the renal failure was      resolved.  The patient continued to do well.  We also started him      Flomax during this hospital stay at 0.4 mg because it does not      effect blood pressure as much and will help the treatment of BPH.  4. Hyperkalemia.  The patient had a markedly elevated potassium on      admission of 6.1; however, there were no EKG changes; therefore, we      gave him Kayexalate 15 grams p.o. x2 and we rechecked the BMET and      continued his IV fluids.  Once his potassium was normalized, then      in fact, he became hypokalemic and we had to add potassium to his      fluids.  We will give him potassium chloride x1.  5. Anemia.  It was noted on admission that this patient was anemic      with a hemoglobin of 8.4.  Throughout his hospital stay this      trended down.  Part of this was illusional; however, part of this      was also concerning and on May 8, his hemoglobin dropped down to      7.1.  Although he was asymptomatic, we felt that this trend was      concerning and, therefore, transfused him with 2 units of packed      red blood cells after typing and crossing him.  We feel that after      further workup, his iron studies for the most part were normal.  He      did not have an iron deficiency anemia as his MCV is  normal and his      iron stores are normal.  His folate and B12 were also normal.  His      heptoglobin was elevated, which argues against the hemolytic      disease.  Therefore, we feel that his anemia is secondary to      probably an ongoing renal insufficiency that was secondary to his      obstructive problem.  This patient, most likely, has had this      obstructive BPH for some time and, therefore, his kidneys have been      damaged to some extent and could have caused anemia due to     decreased production of red blood cells.  His primary [facility]      physician at  [facility] can further workup this patient.  Please note      that on a CT scan done at this hospital, there was a suspicious      lesion found in the colon, which may need further workup if the      patient desires in an outpatient setting.  However, we felt this      did not need to workup for malignancy during this hospital stay.      The patient remained fecal occult blood negative throughout his      hospital admission and on day of discharge, his hemoglobin remained      stable for the second day in a row and was 9.9 at time of      discharge.  6. Hypertension.  The patient has a history of hypertension; however,      we had to hold his atenolol and his Cardura due to hypotension      during this hospital stay.  Patient will follow up with primary      [facility] physician who can restart these medications if he feels      necessary.  7. Hyperphosphatemia.  Please note that during this hospital stay, we      checked the patient's phosphorus and it was noted to be elevated at      9.4; therefore, we started PhosLo tablets with meals to help bind      the phosphorus and by day of discharge his phosphorus was within      normal limits at 2.5.  He did not need to go home on his PhosLo      regimen and will no longer need assistance with this.  8. Metabolic acidosis.  The patient was noted to have anion gap of 20      due to metabolic acidosis, which is secondary to his azotemia.  We      added sodium bicarb to his IV fluids.  Initially, 2 amps and then      decreased to 1 amp and finally discontinued as his bicarb corrected      as his renal failure improved and that by day of discharge, he was      no longer requiring bicarb and his bicarb was normal at 24.  9. Questionable UTI.  On hospital admission, we were concerned that      the patient had a UTI and, therefore, started him on Unasyn to      cover possible enterococcus; however, the urine culture did not      grow out any bugs and,  therefore, we discontinued his Unasyn after      day number 2.   DISPOSITION:  We did  get a physical therapy and occupational therapy  consult prior to discharge.  The patient tolerated the PT/OT very well,  in fact was very strong; however, he wished to continue this at home.  Therefore, home health was consulted and he will receive home physical  therapy until he can regain his strength back.  It is noted that the  area where he lives, there is physical therapy in that area and our case  manager was working on setting that up at time of discharge.   FOLLOWUP:  Patient will follow up with at [facility] with Dr.  Isabel Caprice on Oct 10, 2006 at 2:30 p.m.  He will also return to [facility] as needed for routine followup.   FOLLOWUP INSTRUCTIONS:  The patient has a Foley catheter in place and  home health will come out to his house and educate him on Foley catheter maintenance.  Patient will need labs at his appointment in urology,  including a BMP for sugars and possibly a CBC if needed.   CONSULTATIONS:  Urology.   IMAGES:  CT of the abdomen and pelvis and a renal ultrasound.  A CT of  the abdomen and pelvis, as already dictated, showed a markedly enlarged  prostate, moderate severe hydronephrosis and enlarged bladder, as did  the renal ultrasound, which in addition showed a benign cyst.   PERTINENT LABS:  Most of these I dictated, however, again basic  metabolic panel, on admission was significant for a potassium of 6.1 and  a creatinine of 14.15 and a BUN of 199 and a low bicarb of 15.  By day  of admission, the basic metabolic panel was within normal limits, except  for a creatinine of 2.62.  His phosphorus was elevated during this  hospital stay at 9.4; however, by day of discharge, it was 2.5.  His  hemoglobin, on admission was 8.4 and on day of discharge it was 9.9.  Folate within normal limits at 19.1, B12 within normal limits of 727.  A  urinalysis, done initially,  was concerning for a UTI with large  esterase, large blood and large protein.  However, urine cultures did  not grow out any bugs.  Iron studies showed a ferritin of 817, high.  Iron studies, iron 59 normal, total iron binding capacity 182, which is  a little low, iron saturation 32%.  LDH 109, normal.  Reticulocyte count  is low at a percentage of 0.7 and an absolute of 17.1.  Heptoglobin  elevated at 248.  These are the pertinent labs obtained during this  hospital stay.   The patient was discharged in improved and stable condition.           ______________________________  [name], M.D.     JT/MEDQ  D:  10/06/2006  T:  10/06/2006  Job:  [id number]   cc:   Leighton Roach McDiarmid, M.D.  Valetta Fuller, M.D.  [facility]

## 2010-10-14 NOTE — H&P (Signed)
NAMECLEATIS, FANDRICH NO.:  [id number]   MEDICAL RECORD NO.:  [id number]          PATIENT TYPE:  INP   LOCATION:  6727                         FACILITY:  [facility]   PHYSICIAN:  Levander Campion, M.D.  DATE OF BIRTH:  02/22/1943   DATE OF ADMISSION:  10/01/2006  DATE OF DISCHARGE:                              HISTORY & PHYSICAL   PRIMARY CARE PHYSICIAN:  [facility].   CHIEF COMPLAINT:  Patient was sent from [facility] secondary to  reported renal failure.   HISTORY OF PRESENT ILLNESS:  [facility] is a very pleasant 77 year old  male who was in his normal state of health until he began to have  decreased appetite, fatigue, an unintentional 15 pound weight loss,  lower abdominal discomfort and bloating about 2 months ago.  He has  gotten progressively worse.  He denies fevers, chills, nausea, vomiting,  diarrhea or constipation.  He has daily small soft stools.  He denies  melena or bright red blood per rectum.  He does have some discomfort  with urination and sometimes has trouble emptying his bladder beginning  about 2 months ago as well.  He went to [facility] on Saturday,  Sep 29, 2006, upon urging of his wife.  They found a large abdominal  mass, palpated on exam and seen on KUB, as well as a questionable right  lower lobe density at the diaphragm on chest x-ray.  A urinalysis, at  the time, showed too numerous to count white blood cells and red blood  cells.  The patient states he noticed the mass also about 2 months ago.  The patient was treated for presumed UTI with Levaquin 500 mg daily x5  days and was to have a CT arranged as an outpatient.  A CMET was drawn  at the time as well and the patient's wife was called on Oct 01, 2006  with the results, which showed a creatinine of 10.5 and a BUN of 158 and  the patient was told to report to [facility] for  admission and workup.   PAST MEDICAL HISTORY:  1.  Hypertension.  2. Retinal hemorrhage repaired in 2005.  3. Benign prostatic hyperplasia.  Negative workup.  He had a negative      workup for a malignancy by Dr. Isabel Caprice many years ago, but has had      not recent followup.  4. Hiatal hernia.  5. Cataract surgery.   MEDICATIONS:  1. Lisinopril/HCTZ 20/25 mg.  2. Cardura 8 mg daily.  3. Atenolol 15 mg daily.   ALLERGIES:  NO KNOWN DRUG ALLERGIES.   FAMILY HISTORY:  His father passed away at age 46 of emphysema.  His  mother passed away from Alzheimer.  He has a brother, who died 8 years  ago, it was smoking related.  He has 3 daughters, who are healthy.   SOCIAL HISTORY:  The patient lives with his wife at [place].  They  are independent, both drive and perform all ADLs and IDLs.  The patient  has 3 daughters that live in  [place], [place] and [place].  He  has no tobacco, ethanol or drug history.   REVIEW OF SYSTEMS:  Greater than 10 systems were reviewed, all were  negative except per HPI.   PHYSICAL EXAMINATION:  VITAL SIGNS:  Temperature is 97.1.  Pulse 65.  Blood pressure 107/49.  Respirations 20, 97% on room air.  GENERAL:  Patient was alert and oriented, pleasant and in no acute  distress.  HEENT:  Head:  Normocephalic, atraumatic.  Eyes:  Extraocular muscles  were intact.  Pupils were equally round and reactive to light and  sclerae were clear.  The nares were clear.  Oropharynx was clear without  erythema or exudate.  He did have slightly dry mucous membranes.  NECK:  Had no cervical nodes.  No subclavian nodes.  The neck was supple  and nontender.  CARDIOVASCULAR:  Regular rate and rhythm with no murmurs, rubs or  gallops.  He had 2+ distal pulses bilaterally.  PULMONARY:  Lungs were clear to auscultation bilaterally with no  increased work of breathing.  No wheezes.  No crackles.  ABDOMEN:  Soft.  Patient had a very large tender, movable pelvic mass  that decreased in size after in and out  catheterization, which yield  greater than 1000 mL of murky urine.  He had normoactive bowel sounds  and no hepatosplenomegaly.  RECTAL EXAM:  Patient had a very enlarged prostate with a central firm  nodule.  It was nontender.  He had heme negative stools and normal tone.  EXTREMITIES:  Nontender with no edema.  NEUROLOGICAL EXAM:  Nonfocal.  SKIN:  No lesions or sores.   LABS:  At [facility], on Sep 29, 2006, included a hemoglobin of  8.8, a TSH of 1.612.  Electrolytes:  Sodium 135, potassium of 5.7,  chloride of 99, bicarb of 14, creatinine of 10.5, a BUN of 158, a  glucose of 119 and a calcium of 9.7.  A urine culture was no growth.  Albumin was 3.8 and all other LFTs were within normal limits.  At the  [facility], on Oct 01, 2006, patient had a white  blood count of 12.7, a hemoglobin of 8.4, hematocrit of 24.4, platelets  of 500 and 90% neutrophils.  Patient had a sodium of 135, a potassium of  6.1, a chloride of 102, a bicarb of 15, a BUN of 199, creatinine of  14.15 and he had hemoccult negative stools and an albumin of 2.8;  otherwise, LFTs were within normal limits.  His urine showed a specific  gravity of 1.014, greater than 300 of protein, large blood and large  leukocyte esterase.  Urine gram stain was negative for bacteria, but  positive for many PMNs.  A urine culture was pending.  A blood culture  was pending.  Urine sodium was 120 and a urine creatinine was pending.  An EKG showed no peak T-waves and normal sinus rhythm.   ASSESSMENT/PLAN:  [name] is an 77 year old male with renal failure  likely secondary to an obstructive process.   1. Renal.  Given large volume, greater than 1 liter of urine obtained      from I&O cath in the emergency room and tender mass, decreasing in      size after the cath.  The likely source of his renal failure is      obstruction.  The mass is likely the patient's bladder.  Most     likely his extremely  enlarged  prostate is causing the obstruction.      We will place an indwelling Foley catheter and monitor strict Is      and Os.  We will check a FENa to ensure not a prerenal cause,      although very unlikely.  We will repeat a BUN and creatinine in the      morning and will likely need both renal and urology consults in the      morning.  We will obtain a CT scan with oral contrast to      characterize the mass, obtain a urine culture, begin empiric      treatment with Unasyn to cover enterococcus and gram-negative.  The      patient was hyperkalemic, but was not uremic, was just azotemic;      therefore, did not qualify for emergent dialysis, but if this      changes we will consider.  2. Fluids, electrolytes and nutrition/gastrointestinal.  Potassium was      6.1.  He had no peaked T-waves on EKG.  We will give Kayexalate 15      grams p.o. x2 and recheck a BMET at 2 a.m. and we will repeat an      EKG in the morning.  We will give maintenance IV fluids half normal      saline at 100 mL per hour and a low-sodium renal diet.  3. Cardiovascular.  Patient had no EKG changes.  We will give      Kayexalate and place on telemetry.  If he does have peak T waves on      telemetry, we will give calcium gluconate and more Kayexalate.  We      will continue Cardura and atenolol.  Hold his ACE and HCTZ.  4. Heme.  Patient has a decreased hemoglobin and hematocrit from a      baseline of 13.9/40.3 in December of 2007, which is likely      secondary to renal failure.  He has no signs or symptoms of acute      blood loss and we will follow this.  We will consider workup as an      outpatient.  Stool was hemoccult negative.  5. Immune deficiency.  Patient had an elevated white blood count and      white blood cells in the urine culture and treat empirically to      cover gram-negatives enterococcus with Unasyn.  We will check a      blood culture.  6. Disposition.  We will admit to telemetry bed  for workup of renal      failure.  7. Code status.  Patient is a full code.           ______________________________  Levander Campion, M.D.     JH/MEDQ  D:  10/03/2006  T:  10/03/2006  Job:  [id number]

## 2011-03-16 LAB — BASIC METABOLIC PANEL
CO2: 27
Calcium: 7.8 — ABNORMAL LOW
Calcium: 7.9 — ABNORMAL LOW
Chloride: 107
Creatinine, Ser: 1.43
Creatinine, Ser: 1.47
Creatinine, Ser: 1.79 — ABNORMAL HIGH
GFR calc Af Amer: 55 — ABNORMAL LOW
GFR calc Af Amer: 57 — ABNORMAL LOW
GFR calc non Af Amer: 47 — ABNORMAL LOW
Glucose, Bld: 116 — ABNORMAL HIGH
Glucose, Bld: 116 — ABNORMAL HIGH
Potassium: 4.9
Sodium: 136

## 2011-03-16 LAB — TYPE AND SCREEN

## 2011-03-16 LAB — CBC
HCT: 25 — ABNORMAL LOW
HCT: 27.4 — ABNORMAL LOW
Hemoglobin: 8.5 — ABNORMAL LOW
Hemoglobin: 8.7 — ABNORMAL LOW
MCHC: 34
MCHC: 34.6
MCHC: 34.6
MCHC: 34.8
MCV: 86.3
MCV: 87.2
Platelets: 216
RBC: 2.88 — ABNORMAL LOW
RBC: 3.19 — ABNORMAL LOW
RDW: 15.6 — ABNORMAL HIGH
RDW: 16.2 — ABNORMAL HIGH
WBC: 9.1

## 2011-03-16 LAB — HEMOGLOBIN AND HEMATOCRIT, BLOOD: HCT: 29.3 — ABNORMAL LOW

## 2011-03-16 LAB — ABO/RH: ABO/RH(D): O NEG

## 2011-08-17 ENCOUNTER — Other Ambulatory Visit: Payer: Self-pay | Admitting: Internal Medicine

## 2011-09-13 ENCOUNTER — Ambulatory Visit (INDEPENDENT_AMBULATORY_CARE_PROVIDER_SITE_OTHER): Payer: Medicare Other | Admitting: Internal Medicine

## 2011-09-13 ENCOUNTER — Encounter: Payer: Self-pay | Admitting: Internal Medicine

## 2011-09-13 VITALS — BP 172/76 | HR 64 | Temp 98.3°F | Resp 16 | Ht 63.0 in | Wt 130.0 lb

## 2011-09-13 DIAGNOSIS — I10 Essential (primary) hypertension: Secondary | ICD-10-CM | POA: Insufficient documentation

## 2011-09-13 DIAGNOSIS — N289 Disorder of kidney and ureter, unspecified: Secondary | ICD-10-CM | POA: Insufficient documentation

## 2011-09-13 LAB — CBC WITH DIFFERENTIAL/PLATELET
Basophils Absolute: 0 10*3/uL (ref 0.0–0.1)
Eosinophils Absolute: 0.1 10*3/uL (ref 0.0–0.7)
Eosinophils Relative: 1 % (ref 0–5)
HCT: 43.5 % (ref 39.0–52.0)
Hemoglobin: 14.8 g/dL (ref 13.0–17.0)
MCH: 31.1 pg (ref 26.0–34.0)
MCV: 91.4 fL (ref 78.0–100.0)
Monocytes Absolute: 0.6 10*3/uL (ref 0.1–1.0)
Neutro Abs: 5.2 10*3/uL (ref 1.7–7.7)
Platelets: 239 10*3/uL (ref 150–400)
RDW: 12.5 % (ref 11.5–15.5)
WBC: 7.9 10*3/uL (ref 4.0–10.5)

## 2011-09-13 MED ORDER — ATENOLOL 50 MG PO TABS: 50.0000 mg | ORAL_TABLET | Freq: Every day | ORAL | Status: DC

## 2011-09-13 MED ORDER — LISINOPRIL-HYDROCHLOROTHIAZIDE 20-12.5 MG PO TABS: 1.0000 | ORAL_TABLET | Freq: Every day | ORAL | Status: DC

## 2011-09-14 LAB — COMPREHENSIVE METABOLIC PANEL
ALT: 14 U/L (ref 0–53)
AST: 31 U/L (ref 0–37)
Chloride: 105 mEq/L (ref 96–112)
Creat: 1.56 mg/dL — ABNORMAL HIGH (ref 0.50–1.35)

## 2011-09-14 NOTE — Progress Notes (Signed)
Subjective:    Patient ID: Eugene Cortez, male    DOB: January 28, 1919, 78 y.o.   MRN: [id number]  Hypertension Pertinent negatives include no blurred vision, chest pain, headaches, neck pain, orthopnea, palpitations, peripheral edema, PND or shortness of breath.    His home blood pressures are stable with occasional systolic elevations. This 78 year old Continues to live a very active life- continuing to play golf, work as a Agricultural consultant at the [employer], sitting in the car church and has remained vibrant in the nursing home community in which he lives. History 76 year old wife he is just is alert although she has several medical problems. He was last here one year ago and has had no problems in the interim. He continues to drive and anticipates stopping at 60 when his license runs out     Review of Systems  Constitutional: Negative for activity change, appetite change, fatigue and unexpected weight change.  HENT: Negative for hearing loss, trouble swallowing, neck pain, dental problem and tinnitus.   Eyes: Negative for blurred vision and visual disturbance.  Respiratory: Negative for cough and shortness of breath.   Cardiovascular: Negative for chest pain, palpitations, orthopnea, leg swelling and PND.  Gastrointestinal: Negative for abdominal pain, diarrhea and constipation.  Genitourinary: Negative for difficulty urinating.       He had a problem with massive BPH which was resolved with surgery in 2008 I believe and he currently has no complaints  Musculoskeletal: Negative for back pain and gait problem.  Neurological: Negative for dizziness, speech difficulty, weakness and headaches.  Hematological: Does not bruise/bleed easily.  Psychiatric/Behavioral: Negative for behavioral problems and sleep disturbance.       Objective:   Physical Exam Vital signs stable HEENT clear including no thyromegaly Heart regular without murmurs/no carotid or abdominal bruits Peripheral  pulses are full and there is no peripheral edema Neurological: Cranial nerves II through XII are intact There are no sensory or motor abnormalities Cerebellar is intact Psychiatric stable    Results for orders placed in visit on 09/13/11  CBC WITH DIFFERENTIAL      Component Value Range   WBC 7.9  4.0 - 10.5 (K/uL)   RBC 4.76  4.22 - 5.81 (MIL/uL)   Hemoglobin 14.8  13.0 - 17.0 (g/dL)   HCT 11.9  14.7 - 82.9 (%)   MCV 91.4  78.0 - 100.0 (fL)   MCH 31.1  26.0 - 34.0 (pg)   MCHC 34.0  30.0 - 36.0 (g/dL)   RDW 56.2  13.0 - 86.5 (%)   Platelets 239  150 - 400 (K/uL)   Neutrophils Relative 66  43 - 77 (%)   Neutro Abs 5.2  1.7 - 7.7 (K/uL)   Lymphocytes Relative 25  12 - 46 (%)   Lymphs Abs 2.0  0.7 - 4.0 (K/uL)   Monocytes Relative 7  3 - 12 (%)   Monocytes Absolute 0.6  0.1 - 1.0 (K/uL)   Eosinophils Relative 1  0 - 5 (%)   Eosinophils Absolute 0.1  0.0 - 0.7 (K/uL)   Basophils Relative 1  0 - 1 (%)   Basophils Absolute 0.0  0.0 - 0.1 (K/uL)   Smear Review Criteria for review not met    COMPREHENSIVE METABOLIC PANEL      Component Value Range   Sodium 141  135 - 145 (mEq/L)   Potassium 4.3  3.5 - 5.3 (mEq/L)   Chloride 105  96 - 112 (mEq/L)   CO2 26  19 -  32 (mEq/L)   Glucose, Bld 102 (*) 70 - 99 (mg/dL)   BUN 28 (*) 6 - 23 (mg/dL)   Creat 0.98 (*) 1.19 - 1.35 (mg/dL)   Total Bilirubin 0.8  0.3 - 1.2 (mg/dL)   Alkaline Phosphatase 76  39 - 117 (U/L)   AST 31  0 - 37 (U/L)   ALT 14  0 - 53 (U/L)   Total Protein 7.5  6.0 - 8.3 (g/dL)   Albumin 4.2  3.5 - 5.2 (g/dL)   Calcium 14.7  8.4 - 10.5 (mg/dL)       Assessment & Plan:  Problem #1 hypertension Problem #2 mild stable renal insufficiency  Lisinopril 20/12.5 and Tenormin 50 mg refill in 3 month increments for one year He will followup as needed His flu shot his done at the nursing home and he is up-to-date on his immunizations

## 2011-09-17 ENCOUNTER — Encounter: Payer: Self-pay | Admitting: Internal Medicine

## 2011-10-18 ENCOUNTER — Encounter: Payer: Self-pay | Admitting: Internal Medicine

## 2012-09-12 ENCOUNTER — Other Ambulatory Visit: Payer: Self-pay | Admitting: Internal Medicine

## 2012-10-23 ENCOUNTER — Encounter: Payer: Medicare Other | Admitting: Internal Medicine

## 2012-11-15 ENCOUNTER — Telehealth: Payer: Self-pay | Admitting: *Deleted

## 2012-11-15 NOTE — Telephone Encounter (Signed)
Called spoke to patient advised appointment has been changed to 11-22-12. He knows about the change.

## 2012-11-20 ENCOUNTER — Encounter: Payer: Medicare Other | Admitting: Internal Medicine

## 2012-11-22 ENCOUNTER — Ambulatory Visit (INDEPENDENT_AMBULATORY_CARE_PROVIDER_SITE_OTHER): Payer: Medicare Other | Admitting: Internal Medicine

## 2012-11-22 ENCOUNTER — Encounter: Payer: Self-pay | Admitting: Internal Medicine

## 2012-11-22 VITALS — BP 150/74 | HR 57 | Temp 98.1°F | Resp 16 | Ht 62.5 in | Wt 123.0 lb

## 2012-11-22 DIAGNOSIS — Z0000 Encounter for general adult medical examination without abnormal findings: Secondary | ICD-10-CM

## 2012-11-22 DIAGNOSIS — I10 Essential (primary) hypertension: Secondary | ICD-10-CM

## 2012-11-22 LAB — CBC WITH DIFFERENTIAL/PLATELET
HCT: 39.5 % (ref 39.0–52.0)
Lymphocytes Relative: 21 % (ref 12–46)
MCH: 30.3 pg (ref 26.0–34.0)
MCHC: 34.2 g/dL (ref 30.0–36.0)
MCV: 88.6 fL (ref 78.0–100.0)
Monocytes Relative: 6 % (ref 3–12)
Neutro Abs: 5 10*3/uL (ref 1.7–7.7)
Neutrophils Relative %: 72 % (ref 43–77)
Platelets: 270 10*3/uL (ref 150–400)
RBC: 4.46 MIL/uL (ref 4.22–5.81)
RDW: 13.3 % (ref 11.5–15.5)
WBC: 6.9 10*3/uL (ref 4.0–10.5)

## 2012-11-22 LAB — COMPREHENSIVE METABOLIC PANEL
Albumin: 3.9 g/dL (ref 3.5–5.2)
CO2: 24 mEq/L (ref 19–32)
Chloride: 107 mEq/L (ref 96–112)
Total Bilirubin: 0.8 mg/dL (ref 0.3–1.2)

## 2012-11-22 LAB — LIPID PANEL
LDL Cholesterol: 200 mg/dL — ABNORMAL HIGH (ref 0–99)
Total CHOL/HDL Ratio: 7.3 Ratio
VLDL: 28 mg/dL (ref 0–40)

## 2012-11-22 MED ORDER — LISINOPRIL-HYDROCHLOROTHIAZIDE 20-12.5 MG PO TABS: 1.0000 | ORAL_TABLET | Freq: Every day | ORAL | Status: DC

## 2012-11-22 MED ORDER — ATENOLOL 50 MG PO TABS: 50.0000 mg | ORAL_TABLET | Freq: Every day | ORAL | Status: DC

## 2012-11-24 ENCOUNTER — Encounter: Payer: Self-pay | Admitting: Internal Medicine

## 2012-11-24 NOTE — Progress Notes (Signed)
Subjective:    Patient ID: Eugene Cortez, male    DOB: [birth year], 79 y.o.   MRN: [id number]  HPIannual Patient Active Problem List   Diagnosis Date Noted  . HTN (hypertension) 09/13/2011  . 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 Oldest daugh age [age]yo ggd just grad hs in [place] Misses friends his age and works to adjust to a culture of freq funerals Nodepr/no fall risk Has living will  psh -massive bph 2008 dr Isabel Caprice  [place] dr mann2009///history of severe diverticulosis     Review of Systems 13 pt neg    Objective:   Physical Exam BP 150/74  Pulse 57  Temp(Src) 98.1 F (36.7 C) (Oral)  Resp 16  Ht 5' 2.5" (1.588 m)  Wt 123 lb (55.792 kg)  BMI 22.12 kg/m2  SpO2 97% No acute distress HEENT is clear Heart regular without murmur Lungs clear Abdomen supple without masses or organomegaly Extremities with good peripheral pulses/no inguinal bruits/no abdominal bruits/no carotid bruits No edema Neurological intact Mood good/affect appropriate       Assessment & Plan:  Annual Patient Active Problem List   Diagnosis Date Noted  . HTN (hypertension) 09/13/2011  . Renal insufficiency 09/13/2011   Results for orders placed in visit on 11/22/12  CBC WITH DIFFERENTIAL      Result Value Range   WBC 6.9  4.0 - 10.5 K/uL   RBC 4.46  4.22 - 5.81 MIL/uL   Hemoglobin 13.5  13.0 - 17.0 g/dL   HCT 40.9  81.1 - 91.4 %   MCV 88.6  78.0 - 100.0 fL   MCH 30.3  26.0 - 34.0 pg   MCHC 34.2  30.0 - 36.0 g/dL   RDW 78.2  95.6 - 21.3 %   Platelets 270  150 - 400 K/uL   Neutrophils Relative % 72  43 - 77 %   Neutro Abs 5.0  1.7 - 7.7 K/uL   Lymphocytes Relative 21  12 - 46 %   Lymphs Abs 1.5  0.7 - 4.0 K/uL   Monocytes Relative 6  3 - 12 %   Monocytes Absolute 0.4  0.1 - 1.0 K/uL   Eosinophils Relative 1  0 - 5 %   Eosinophils Absolute 0.1  0.0 - 0.7 K/uL   Basophils Relative 0  0 - 1 %   Basophils Absolute 0.0  0.0 - 0.1 K/uL   Smear Review Criteria for review not met    COMPREHENSIVE METABOLIC PANEL      Result Value Range   Sodium 142  135 - 145 mEq/L   Potassium 4.0  3.5 - 5.3 mEq/L   Chloride 107  96 - 112 mEq/L   CO2 24  19 - 32 mEq/L   Glucose, Bld 85  70 - 99 mg/dL   BUN 33 (*) 6 - 23 mg/dL   Creat-------- stab  0.86 (*) 0.50 - 1.35 mg/dL  Total Bilirubin 0.8  0.3 - 1.2 mg/dL   Alkaline Phosphatase 68  39 - 117 U/L   AST 19  0 - 37 U/L   ALT 8  0 - 53 U/L   Total Protein 7.4  6.0 - 8.3 g/dL   Albumin 3.9  3.5 - 5.2 g/dL   Calcium 9.4  8.4 - 40.9 mg/dL  LIPID PANEL      Result Value Range   Cholesterol 264 (*) 0 - 200 mg/dL   Triglycerides 811  <914 mg/dL   HDL 36 (*) >78 mg/dL   Total CHOL/HDL Ratio 7.3     VLDL 28  0 - 40 mg/dL   LDL Cholesterol----elevated compared to 2003 when he and pcp decided not to treat 200 (*) 0 - 99 mg/dL

## 2012-11-25 MED ORDER — ATENOLOL 50 MG PO TABS: 50.0000 mg | ORAL_TABLET | Freq: Every day | ORAL | Status: DC

## 2012-11-25 MED ORDER — LISINOPRIL-HYDROCHLOROTHIAZIDE 20-12.5 MG PO TABS: 1.0000 | ORAL_TABLET | Freq: Every day | ORAL | Status: DC

## 2012-11-25 NOTE — Addendum Note (Signed)
Addended byCaffie Damme on: 11/25/2012 03:44 PM   Modules accepted: Orders

## 2012-12-01 ENCOUNTER — Encounter: Payer: Self-pay | Admitting: Internal Medicine

## 2013-08-27 ENCOUNTER — Encounter: Payer: Self-pay | Admitting: Internal Medicine

## 2013-08-27 ENCOUNTER — Ambulatory Visit (INDEPENDENT_AMBULATORY_CARE_PROVIDER_SITE_OTHER): Payer: Medicare Other | Admitting: Internal Medicine

## 2013-08-27 VITALS — BP 155/68 | HR 54 | Temp 98.3°F | Resp 16 | Ht 63.25 in | Wt 117.8 lb

## 2013-08-27 DIAGNOSIS — Z23 Encounter for immunization: Secondary | ICD-10-CM

## 2013-08-27 DIAGNOSIS — Z1211 Encounter for screening for malignant neoplasm of colon: Secondary | ICD-10-CM

## 2013-08-27 DIAGNOSIS — I10 Essential (primary) hypertension: Secondary | ICD-10-CM

## 2013-08-27 DIAGNOSIS — H9190 Unspecified hearing loss, unspecified ear: Secondary | ICD-10-CM

## 2013-08-27 DIAGNOSIS — Z0000 Encounter for general adult medical examination without abnormal findings: Secondary | ICD-10-CM

## 2013-08-27 DIAGNOSIS — Z139 Encounter for screening, unspecified: Secondary | ICD-10-CM

## 2013-08-27 DIAGNOSIS — N289 Disorder of kidney and ureter, unspecified: Secondary | ICD-10-CM

## 2013-08-27 LAB — CBC WITH DIFFERENTIAL/PLATELET
BASOS ABS: 0 10*3/uL (ref 0.0–0.1)
Basophils Relative: 0 % (ref 0–1)
EOS ABS: 0.1 10*3/uL (ref 0.0–0.7)
EOS PCT: 1 % (ref 0–5)
HCT: 40.5 % (ref 39.0–52.0)
HEMOGLOBIN: 13.7 g/dL (ref 13.0–17.0)
LYMPHS PCT: 26 % (ref 12–46)
Lymphs Abs: 1.8 10*3/uL (ref 0.7–4.0)
MCH: 30.9 pg (ref 26.0–34.0)
MCHC: 33.8 g/dL (ref 30.0–36.0)
MCV: 91.2 fL (ref 78.0–100.0)
MONO ABS: 0.6 10*3/uL (ref 0.1–1.0)
Monocytes Relative: 8 % (ref 3–12)
Neutro Abs: 4.6 10*3/uL (ref 1.7–7.7)
Neutrophils Relative %: 65 % (ref 43–77)
PLATELETS: 271 10*3/uL (ref 150–400)
RBC: 4.44 MIL/uL (ref 4.22–5.81)
RDW: 13.4 % (ref 11.5–15.5)
WBC: 7 10*3/uL (ref 4.0–10.5)

## 2013-08-27 LAB — COMPLETE METABOLIC PANEL WITH GFR
ALT: 9 U/L (ref 0–53)
AST: 18 U/L (ref 0–37)
Albumin: 4 g/dL (ref 3.5–5.2)
Alkaline Phosphatase: 76 U/L (ref 39–117)
BILIRUBIN TOTAL: 0.7 mg/dL (ref 0.2–1.2)
BUN: 31 mg/dL — AB (ref 6–23)
CHLORIDE: 105 meq/L (ref 96–112)
CO2: 27 mEq/L (ref 19–32)
CREATININE: 1.74 mg/dL — AB (ref 0.50–1.35)
Calcium: 9.7 mg/dL (ref 8.4–10.5)
GFR, EST AFRICAN AMERICAN: 38 mL/min — AB
GFR, Est Non African American: 33 mL/min — ABNORMAL LOW
Glucose, Bld: 97 mg/dL (ref 70–99)
Potassium: 4 mEq/L (ref 3.5–5.3)
Sodium: 142 mEq/L (ref 135–145)
Total Protein: 7.4 g/dL (ref 6.0–8.3)

## 2013-08-27 NOTE — Progress Notes (Signed)
   Subjective:    Patient ID: Eugene Cortez, male    DOB: [birth year], 80 y.o.   MRN: [id number]  HPI    Review of Systems  Constitutional: Negative.   HENT: Negative.   Eyes: Negative.   Respiratory: Negative.   Cardiovascular: Negative.   Gastrointestinal: Negative.   Endocrine: Negative.   Genitourinary: Negative.   Musculoskeletal: Negative.   Skin: Negative.   Allergic/Immunologic: Negative.   Neurological: Negative.   Hematological: Negative.   Psychiatric/Behavioral: Negative.    I have reviewed and agree with documentation. Lacy P. Merla Richesoolittle, M.D.     Objective:   Physical Exam        Assessment & Plan:

## 2013-08-27 NOTE — Progress Notes (Signed)
This chart was scribed for Eugene Pearson, MD by Tana Conch, ED Scribe. This patient was seen in room 24 and the patient's care was started at 11:13 AM.  Subjective:    Patient ID: Eugene Cortez, male    DOB: [birth year], 80 y.o.   MRN: [id number]  HPI  HPI Comments: Eugene Cortez is a 80 y.o. male with a h/o HTN who presents to the [facility] for his annual exam. Continues to do extremely well! Pt states that if he were not her for his annual exam, he would be playing 9 holes of golf right now. Pt reports that he has measured his bp at rest at 130/70. Pt reports that he exercises at least 2x a week at [employer] retirement community in the fitness area. Pt reports that he is still active in the church as a member of the choir. Pt reports that he takes care of his wife who is not in the condition that the pt is in. -early memory issues--health issues  No change in other aspects PMH,PSH,SH   Review of Systems  Constitutional: Positive for activity change (due to HTN, age).  Psychiatric/Behavioral: Negative for confusion.  All other systems reviewed and are negative.  see ros per CNA     Objective:   Physical Exam  Nursing note and vitals reviewed. Constitutional: He is oriented to person, place, and time. He appears well-developed and well-nourished. No distress.  HENT:  Head: Normocephalic and atraumatic.  Right Ear: External ear normal.  Left Ear: External ear normal.  Nose: Nose normal.  Mouth/Throat: Oropharynx is clear and moist.  Mild hearing loss bilat  Eyes: Conjunctivae and EOM are normal. Pupils are equal, round, and reactive to light.  Neck: Normal range of motion. Neck supple. No thyromegaly present.  No carotid bruit  Cardiovascular: Normal rate, regular rhythm, normal heart sounds and intact distal pulses.   No murmur heard.  No carotid or abdominal bruit  Pulmonary/Chest: Effort normal and breath sounds normal.  Abdominal: Soft. Bowel sounds are normal. He  exhibits no mass. There is no tenderness.  Musculoskeletal: Normal range of motion. He exhibits no edema and no tenderness.  Lymphadenopathy:    He has no cervical adenopathy.  Neurological: He is alert and oriented to person, place, and time. He has normal reflexes. No cranial nerve deficit.  Psychiatric: He has a normal mood and affect. His behavior is normal. Judgment and thought content normal.   BP 155/68  Pulse 54  Temp(Src) 98.3 F (36.8 C) (Oral)  Resp 16  Ht 5' 3.25" (1.607 m)  Wt 117 lb 12.8 oz (53.434 kg)  BMI 20.69 kg/m2  SpO2 98%        Assessment & Plan:  Annual exam WUJ:WJXBJ insufficiency - Plan: COMPLETE METABOLIC PANEL WITH GFR  HTN (hypertension) - Plan: CBC with Differential, COMPLETE METABOLIC PANEL WITH GFR  Hearing loss  Need for prophylactic vaccination against Streptococcus pneumoniae (pneumococcus) - Plan: Pneumococcal polysaccharide vaccine 23-valent greater than or equal to 2yo subcutaneous/IM  Screening for colon cancer - Plan: IFOBT POC (occult bld, rslt in office)  Medicare wellness visit subsequent--meets all parameters Doing extremely well     11:22 AM-Discussed treatment plan which includes pneumonia shot with pt at bedside and pt agreed to plan.   I personally performed the services described in this documentation, which was scribed in my presence. The recorded information has been reviewed and is accurate. I have completed the patient encounter in its entirety as documented by the  scribe, with editing by me where necessary. Angeldejesus P. Merla Richesoolittle, M.D.

## 2013-09-01 ENCOUNTER — Encounter: Payer: Self-pay | Admitting: Internal Medicine

## 2013-09-01 LAB — IFOBT (OCCULT BLOOD): IFOBT: NEGATIVE

## 2013-10-02 ENCOUNTER — Other Ambulatory Visit: Payer: Self-pay | Admitting: Internal Medicine

## 2014-06-09 ENCOUNTER — Other Ambulatory Visit: Payer: Self-pay

## 2014-06-09 MED ORDER — ATENOLOL 50 MG PO TABS: 50.0000 mg | ORAL_TABLET | Freq: Every day | ORAL | Status: DC

## 2014-06-09 MED ORDER — LISINOPRIL-HYDROCHLOROTHIAZIDE 20-12.5 MG PO TABS: 1.0000 | ORAL_TABLET | Freq: Every day | ORAL | Status: DC

## 2014-06-09 NOTE — Telephone Encounter (Signed)
Exp Scripts sent req for RFs of atenolol and lisinopril HCT. Dr Merla Richesoolittle, pt has appt w/you on 09/02/14. Do you want to RF until then? Pt last seen for check up 08/27/13.

## 2014-09-02 ENCOUNTER — Encounter: Payer: Medicare Other | Admitting: Internal Medicine

## 2014-09-04 ENCOUNTER — Other Ambulatory Visit: Payer: Self-pay | Admitting: Internal Medicine

## 2014-09-30 ENCOUNTER — Encounter: Payer: Self-pay | Admitting: Internal Medicine

## 2014-09-30 ENCOUNTER — Ambulatory Visit (INDEPENDENT_AMBULATORY_CARE_PROVIDER_SITE_OTHER): Payer: Medicare Other | Admitting: Internal Medicine

## 2014-09-30 VITALS — BP 161/72 | HR 58 | Temp 97.8°F | Resp 16 | Ht 63.0 in | Wt 117.0 lb

## 2014-09-30 DIAGNOSIS — I10 Essential (primary) hypertension: Secondary | ICD-10-CM | POA: Diagnosis not present

## 2014-09-30 DIAGNOSIS — N289 Disorder of kidney and ureter, unspecified: Secondary | ICD-10-CM | POA: Diagnosis not present

## 2014-09-30 DIAGNOSIS — Z0000 Encounter for general adult medical examination without abnormal findings: Secondary | ICD-10-CM

## 2014-09-30 LAB — CBC
HCT: 40.6 % (ref 39.0–52.0)
Hemoglobin: 13.5 g/dL (ref 13.0–17.0)
MCH: 30.5 pg (ref 26.0–34.0)
MCHC: 33.3 g/dL (ref 30.0–36.0)
MCV: 91.6 fL (ref 78.0–100.0)
MPV: 8.7 fL (ref 8.6–12.4)
Platelets: 281 10*3/uL (ref 150–400)
RBC: 4.43 MIL/uL (ref 4.22–5.81)
RDW: 13 % (ref 11.5–15.5)
WBC: 8.3 10*3/uL (ref 4.0–10.5)

## 2014-09-30 LAB — COMPREHENSIVE METABOLIC PANEL
ALK PHOS: 74 U/L (ref 39–117)
ALT: 10 U/L (ref 0–53)
AST: 19 U/L (ref 0–37)
Albumin: 4 g/dL (ref 3.5–5.2)
BUN: 33 mg/dL — ABNORMAL HIGH (ref 6–23)
CO2: 25 mEq/L (ref 19–32)
Calcium: 9.3 mg/dL (ref 8.4–10.5)
Chloride: 104 mEq/L (ref 96–112)
Creat: 1.85 mg/dL — ABNORMAL HIGH (ref 0.50–1.35)
Glucose, Bld: 95 mg/dL (ref 70–99)
Potassium: 4.1 mEq/L (ref 3.5–5.3)
Sodium: 142 mEq/L (ref 135–145)
Total Bilirubin: 0.8 mg/dL (ref 0.2–1.2)
Total Protein: 7.9 g/dL (ref 6.0–8.3)

## 2014-09-30 NOTE — Progress Notes (Signed)
Subjective:    Patient ID: Eugene Cortez, male    DOB: Jan 27, 1919, 81 y.o.   MRN: [id number]  HPI Patient presents today for CPE. 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 Wife has some mild dementia. He reports that he is sleeping well. His wife sleeps well, she does not wander, but is very sedentary. [facility] since he does not know what condition he will be in by this time next year and he is thinking he would like to commit to driving less.   he continues to play golf 3 days a week.  Last CPE- 08/27/2013-- Reviewed chart notes  He feels well with plenty of energy and feels no cognitive decline.   I reviewed his questions for depression and all are negative  I reviewed his risk for falling and nothing is present that would place him at greater risk   his hearing remains good  Vision remains intact  He has no difficulty remembering or making decisions  Stairs present no difficulty  He cares for himself completely with regard to addressing bathing and eating  He continues to drive to accomplish errands  Past Medical History  Diagnosis Date  . Cataract   . Hypertension    Past Surgical History  Procedure Laterality Date  . Prostate surgery     Family History  Problem Relation Age of Onset  . Dementia Mother   . Cancer Father   . Lung cancer Father   . Heart disease Brother    History  Substance Use Topics  . Smoking status: Never Smoker   . Smokeless tobacco: Not on file  . Alcohol Use: No  Medications, allergies, past medical history, surgical history, family history, social history and problem list reviewed and updated.    Review of Systems  Constitutional: Negative.   HENT: Positive for rhinorrhea (while eating).   Eyes: Negative.   Respiratory: Negative.   Cardiovascular: Negative.   Gastrointestinal: Negative.   Endocrine: Negative.    Genitourinary: Negative.   Musculoskeletal: Negative.   Skin: Negative.   Allergic/Immunologic: Negative.   Neurological: Negative.   Hematological: Negative.   Psychiatric/Behavioral: Negative.        Objective:   Physical Exam  Constitutional: He is oriented to person, place, and time. He appears well-developed and well-nourished.  HENT:  Head: Normocephalic and atraumatic.  Right Ear: Hearing, tympanic membrane, external ear and ear canal normal.  Left Ear: Hearing, tympanic membrane, external ear and ear canal normal.  Nose: Nose normal.  Mouth/Throat: Uvula is midline, oropharynx is clear and moist and mucous membranes are normal.  Eyes: Conjunctivae, EOM and lids are normal. Pupils are equal, round, and reactive to light. Right eye exhibits no discharge. Left eye exhibits no discharge. No scleral icterus.  Neck: Trachea normal and normal range of motion. Neck supple. Carotid bruit is not present.  Cardiovascular: Normal rate, regular rhythm, normal heart sounds, intact distal pulses and normal pulses.   No murmur heard. Pulmonary/Chest: Effort normal and breath sounds normal. No respiratory distress. He has no wheezes. He has no rhonchi. He has no rales.  Abdominal: Soft. Normal appearance and bowel sounds are normal. He exhibits no abdominal bruit. There is no tenderness.  Musculoskeletal: Normal range of motion. He exhibits no edema or tenderness.  Lymphadenopathy:       Head (right side): No submental, no  submandibular, no tonsillar, no preauricular, no posterior auricular and no occipital adenopathy present.       Head (left side): No submental, no submandibular, no tonsillar, no preauricular, no posterior auricular and no occipital adenopathy present.    He has no cervical adenopathy.  Neurological: He is alert and oriented to person, place, and time. He has normal strength and normal reflexes. No cranial nerve deficit or sensory deficit. Coordination and gait normal.    Skin: Skin is warm, dry and intact. No lesion and no rash noted.  Psychiatric: He has a normal mood and affect. His speech is normal and behavior is normal. Judgment and thought content normal.   BP 161/72 mmHg  Pulse 58  Temp(Src) 97.8 F (36.6 C)  Resp 16  Ht 5\' 3"  (1.6 m)  Wt 117 lb (53.071 kg)  BMI 20.73 kg/m2  SpO2 97%  blood pressures at home reported as better than this       Assessment & Plan:  Annual physical exam  Renal insufficiency - Plan: CBC, Comprehensive metabolic panel  Essential hypertension - Plan: CBC, Comprehensive metabolic panel   at this point there is no reason to change anything  Routine labs Meds ordered this encounter  Medications  . lisinopril-hydrochlorothiazide (PRINZIDE,ZESTORETIC) 20-12.5 MG per tablet    Sig: TAKE 1 TABLET DAILY.    Dispense:  90 tablet    Refill:  3  . atenolol (TENORMIN) 50 MG tablet    Sig: TAKE 1 TABLET DAILY    Dispense:  90 tablet    Refill:  3

## 2014-10-02 ENCOUNTER — Encounter: Payer: Self-pay | Admitting: *Deleted

## 2014-10-02 MED ORDER — LISINOPRIL-HYDROCHLOROTHIAZIDE 20-12.5 MG PO TABS: ORAL_TABLET | ORAL | Status: DC

## 2014-10-02 MED ORDER — ATENOLOL 50 MG PO TABS: ORAL_TABLET | ORAL | Status: DC

## 2015-09-10 ENCOUNTER — Other Ambulatory Visit: Payer: Self-pay | Admitting: Internal Medicine

## 2015-12-12 ENCOUNTER — Other Ambulatory Visit: Payer: Self-pay | Admitting: Internal Medicine

## 2015-12-16 ENCOUNTER — Other Ambulatory Visit: Payer: Self-pay

## 2015-12-16 MED ORDER — ATENOLOL 50 MG PO TABS: ORAL_TABLET | ORAL | Status: AC

## 2015-12-16 MED ORDER — LISINOPRIL-HYDROCHLOROTHIAZIDE 20-12.5 MG PO TABS: ORAL_TABLET | ORAL | Status: AC

## 2016-01-24 ENCOUNTER — Other Ambulatory Visit: Payer: Self-pay

## 2016-03-16 ENCOUNTER — Other Ambulatory Visit: Payer: Self-pay | Admitting: Family Medicine

## 2016-06-28 ENCOUNTER — Encounter (HOSPITAL_BASED_OUTPATIENT_CLINIC_OR_DEPARTMENT_OTHER): Payer: Self-pay | Admitting: *Deleted

## 2016-06-28 ENCOUNTER — Emergency Department (HOSPITAL_BASED_OUTPATIENT_CLINIC_OR_DEPARTMENT_OTHER)
Admission: EM | Admit: 2016-06-28 | Discharge: 2016-06-28 | Disposition: A | Discharge status: Home / Self Care | Payer: Medicare Other | Attending: Emergency Medicine | Admitting: Emergency Medicine

## 2016-06-28 DIAGNOSIS — Z960 Presence of urogenital implants: Secondary | ICD-10-CM | POA: Diagnosis not present

## 2016-06-28 DIAGNOSIS — Z79899 Other long term (current) drug therapy: Secondary | ICD-10-CM | POA: Diagnosis not present

## 2016-06-28 DIAGNOSIS — T83038A Leakage of other indwelling urethral catheter, initial encounter: Secondary | ICD-10-CM | POA: Diagnosis present

## 2016-06-28 DIAGNOSIS — T83511A Infection and inflammatory reaction due to indwelling urethral catheter, initial encounter: Secondary | ICD-10-CM | POA: Insufficient documentation

## 2016-06-28 DIAGNOSIS — I10 Essential (primary) hypertension: Secondary | ICD-10-CM | POA: Diagnosis not present

## 2016-06-28 DIAGNOSIS — Y733 Surgical instruments, materials and gastroenterology and urology devices (including sutures) associated with adverse incidents: Secondary | ICD-10-CM | POA: Insufficient documentation

## 2016-06-28 DIAGNOSIS — N390 Urinary tract infection, site not specified: Secondary | ICD-10-CM

## 2016-06-28 LAB — CBC WITH DIFFERENTIAL/PLATELET
BASOS PCT: 0 %
Basophils Absolute: 0 10*3/uL (ref 0.0–0.1)
EOS PCT: 6 %
Eosinophils Absolute: 0.4 10*3/uL (ref 0.0–0.7)
HCT: 32.1 % — ABNORMAL LOW (ref 39.0–52.0)
Hemoglobin: 10.5 g/dL — ABNORMAL LOW (ref 13.0–17.0)
LYMPHS ABS: 1.6 10*3/uL (ref 0.7–4.0)
Lymphocytes Relative: 22 %
MCH: 30.4 pg (ref 26.0–34.0)
MCHC: 32.7 g/dL (ref 30.0–36.0)
MCV: 93 fL (ref 78.0–100.0)
MONOS PCT: 12 %
Monocytes Absolute: 0.8 10*3/uL (ref 0.1–1.0)
NEUTROS PCT: 60 %
Neutro Abs: 4.2 10*3/uL (ref 1.7–7.7)
PLATELETS: 284 10*3/uL (ref 150–400)
RBC: 3.45 MIL/uL — AB (ref 4.22–5.81)
RDW: 13.6 % (ref 11.5–15.5)
WBC: 7.1 10*3/uL (ref 4.0–10.5)

## 2016-06-28 LAB — URINALYSIS, ROUTINE W REFLEX MICROSCOPIC
BILIRUBIN URINE: NEGATIVE
Glucose, UA: NEGATIVE mg/dL
KETONES UR: NEGATIVE mg/dL
NITRITE: NEGATIVE
Protein, ur: >300 mg/dL — AB
SPECIFIC GRAVITY, URINE: 1.014 (ref 1.005–1.030)
pH: 6 (ref 5.0–8.0)

## 2016-06-28 LAB — BASIC METABOLIC PANEL
Anion gap: 8 (ref 5–15)
BUN: 30 mg/dL — AB (ref 6–20)
CALCIUM: 8.9 mg/dL (ref 8.9–10.3)
CO2: 25 mmol/L (ref 22–32)
CREATININE: 1.7 mg/dL — AB (ref 0.61–1.24)
Chloride: 103 mmol/L (ref 101–111)
GFR, EST AFRICAN AMERICAN: 38 mL/min — AB (ref >60)
GFR, EST NON AFRICAN AMERICAN: 32 mL/min — AB (ref >60)
Glucose, Bld: 130 mg/dL — ABNORMAL HIGH (ref 65–99)
Potassium: 3.7 mmol/L (ref 3.5–5.1)
SODIUM: 136 mmol/L (ref 135–145)

## 2016-06-28 LAB — URINALYSIS, MICROSCOPIC (REFLEX)

## 2016-06-28 MED ORDER — CEPHALEXIN 500 MG PO CAPS: 500.0000 mg | ORAL_CAPSULE | Freq: Four times a day (QID) | ORAL | 0 refills | Status: AC

## 2016-06-28 NOTE — ED Notes (Signed)
Pt's f/c that was in place from facility PTA to ED has been removed and replaced with a new one. Noted erosion to the underside of the penis from chronic cath in place.

## 2016-06-28 NOTE — ED Provider Notes (Signed)
[facility] Provider Note   CSN: [id number] Arrival date & time: 06/28/16  1820  By signing my name below, I, Bing NeighborsMaurice Deon Copeland Jr., attest that this documentation has been prepared under the direction and in the presence of Benjiman CoreNathan Jose Corvin, MD. Electronically signed: Bing NeighborsMaurice Deon Copeland Jr., ED Scribe. 06/28/16. 11:41 PM.'   History   Chief Complaint Chief Complaint  Patient presents with  . Urinary Tract Infection    HPI  Eugene EndRobert Cortez is a 83 y.o. male who [facility] complaining of UTI with sudden onset x2 weeks. Pt presents to the ED with cloudy drainage from his urinary catheter. Pt states that his urine is cloudy and has a green tint to it. He states that the urine changed colors x2 weeks ago. Pt denies cough, SOB, confusion.  The history is provided by the patient. No language interpreter was used.    Past Medical History:  Diagnosis Date  . Cataract   . Hypertension     Patient Active Problem List   Diagnosis Date Noted  . HTN (hypertension) 09/13/2011  . Renal insufficiency 09/13/2011    Past Surgical History:  Procedure Laterality Date  . PROSTATE SURGERY         Home Medications    Prior to Admission medications   Medication Sig Start Date Cortez Date Taking? Authorizing Provider  atenolol (TENORMIN) 50 MG tablet TAKE 1 TABLET DAILY (NO MORE REFILLS WITHOUT OFFICE VISIT) 12/16/15   Ethelda ChickKristi M Smith, MD  cephALEXin (KEFLEX) 500 MG capsule Take 1 capsule (500 mg total) by mouth 4 (four) times daily. 06/28/16   Benjiman CoreNathan Nike Southwell, MD  Cholecalciferol (VITAMIN D3) 1000 UNITS CAPS Take by mouth.    Historical Provider, MD  lisinopril-hydrochlorothiazide (PRINZIDE,ZESTORETIC) 20-12.5 MG tablet TAKE 1 TABLET DAILY (OFFICE VISIT NEEDED FOR REFILLS) 12/16/15   Ethelda ChickKristi M Smith, MD  Probiotic Product (SOLUBLE FIBER/PROBIOTICS PO) Take 1 capsule by mouth daily.    Historical Provider, MD    Family History Family History   Problem Relation Age of Onset  . Dementia Mother   . Cancer Father   . Lung cancer Father   . Heart disease Brother     Social History Social History  Substance Use Topics  . Smoking status: Never Smoker  . Smokeless tobacco: Never Used  . Alcohol use No     Allergies   Patient has no known allergies.   Review of Systems Review of Systems  Respiratory: Negative for cough and shortness of breath.   Genitourinary:       Cloudy and Green urine   Psychiatric/Behavioral: Negative for confusion.     Physical Exam Updated Vital Signs BP 167/77   Pulse 81   Temp 98.5 F (36.9 C) (Oral)   Resp 18   Ht 5\' 3"  (1.6 m)   Wt 117 lb (53.1 kg)   SpO2 96%   BMI 20.73 kg/m   Physical Exam  Constitutional: He appears well-developed and well-nourished. He does not appear ill.  HENT:  Head: Normocephalic and atraumatic.  Eyes: Conjunctivae are normal.  Neck: Neck supple.  Cardiovascular: Normal rate and regular rhythm.   No murmur heard. Pulmonary/Chest: Effort normal and breath sounds normal. No respiratory distress.  Lungs clear.  Abdominal: Soft. There is no tenderness.  Suprapubic mass  Genitourinary:  Genitourinary Comments: Foley catheter in place  Musculoskeletal: He exhibits no edema.  No peripheral edema  Neurological: He is alert.  Skin: Skin is warm and dry.  Psychiatric: He has a normal mood and affect.  Nursing note and vitals reviewed.    ED Treatments / Results   DIAGNOSTIC STUDIES: Oxygen Saturation is 96% on RA, adequate by my interpretation.   COORDINATION OF CARE: 11:41 PM-Discussed next steps with pt. Pt verbalized understanding and is agreeable with the plan.    Labs (all labs ordered are listed, but only abnormal results are displayed) Labs Reviewed  URINALYSIS, ROUTINE W REFLEX MICROSCOPIC - Abnormal; Notable for the following:       Result Value   APPearance TURBID (*)    Hgb urine dipstick LARGE (*)    Protein, ur >300 (*)     Leukocytes, UA LARGE (*)    All other components within normal limits  CBC WITH DIFFERENTIAL/PLATELET - Abnormal; Notable for the following:    RBC 3.45 (*)    Hemoglobin 10.5 (*)    HCT 32.1 (*)    All other components within normal limits  BASIC METABOLIC PANEL - Abnormal; Notable for the following:    Glucose, Bld 130 (*)    BUN 30 (*)    Creatinine, Ser 1.70 (*)    GFR calc non Af Amer 32 (*)    GFR calc Af Amer 38 (*)    All other components within normal limits  URINALYSIS, MICROSCOPIC (REFLEX) - Abnormal; Notable for the following:    Bacteria, UA FEW (*)    Squamous Epithelial / LPF 6-30 (*)    All other components within normal limits  URINE CULTURE    EKG  EKG Interpretation None       Radiology No results found.  Procedures Procedures (including critical care time)  Medications Ordered in ED Medications - No data to display   Initial Impression / Assessment and Plan / ED Course  I have reviewed the triage vital signs and the nursing notes.  Pertinent labs & imaging results that were available during my care of the patient were reviewed by me and considered in my medical decision making (see chart for details).     Patient with likely URI tract infection. Foley change. Overall well-appearing and without complaints. Culture sent. Will discharge back to nursing home.  Final Clinical Impressions(s) / ED Diagnoses   Final diagnoses:  Urinary tract infection associated with indwelling urethral catheter, initial encounter (HCC)    New Prescriptions New Prescriptions   CEPHALEXIN (KEFLEX) 500 MG CAPSULE    Take 1 capsule (500 mg total) by mouth 4 (four) times daily.   I personally performed the services described in this documentation, which was scribed in my presence. The recorded information has been reviewed and is accurate.       Benjiman Core, MD 06/28/16 [phone]

## 2016-06-28 NOTE — Discharge Instructions (Signed)
Watch for worsening of condition. Urine culture has been sent.

## 2016-06-28 NOTE — ED Notes (Signed)
Pt awaiting for transport back to [facility]. Facility notified and they are working on transport arrangements.

## 2016-06-28 NOTE — ED Triage Notes (Signed)
Pt arrived via [facility] from [facility]. EMS report that pt is here because of cloudy drainage from his urinary catheter. Pt has chronic foley cath in place. Pt is unsure why he has a foley cath. Pt A/O, NAD.

## 2016-06-28 NOTE — ED Notes (Signed)
Pt's DNR and paperwork given to pt to return back to facility

## 2016-07-01 LAB — URINE CULTURE: Culture: >=100000 — AB

## 2016-07-02 ENCOUNTER — Telehealth: Payer: Self-pay

## 2016-07-02 NOTE — Telephone Encounter (Signed)
Post ED Visit - Positive Culture Follow-up  Culture report reviewed by antimicrobial stewardship pharmacist:  []  Enzo BiNathan Batchelder, Pharm.D. []  Celedonio MiyamotoJeremy Frens, Pharm.D., BCPS []  Garvin FilaMike Maccia, Pharm.D. [x]  Georgina PillionElizabeth Martin, Pharm.D., BCPS []  GlencoeMinh Pham, VermontPharm.D., BCPS, AAHIVP []  Estella HuskMichelle Turner, Pharm.D., BCPS, AAHIVP []  Tennis Mustassie Stewart, Pharm.D. []  Sherle Poeob Vincent, [address] BoulevardPharm.D[address]  Positive urine culture Treated with Cephalexin,   D/C keflex  Not treatment needed per Sharen Hecklaudia Gibbons PAC  Eugene Cortez, Eugene Cortez 07/02/2016, 2:15 PM

## 2017-10-11 ENCOUNTER — Encounter: Payer: Self-pay | Admitting: Family Medicine

## 2017-10-12 ENCOUNTER — Encounter: Payer: Self-pay | Admitting: Family Medicine

## 2017-10-24 ENCOUNTER — Encounter: Payer: Self-pay | Admitting: Family Medicine

## 2017-10-27 DEATH — deceased
